# Patient Record
Sex: Female | Born: 1935 | Hispanic: Yes | Smoking: Never smoker
Health system: Southern US, Community
[De-identification: ages and names within clinical notes are randomized; demographics above are authoritative.]

## PROBLEM LIST (undated history)

## (undated) DIAGNOSIS — K219 Gastro-esophageal reflux disease without esophagitis: Secondary | ICD-10-CM

## (undated) DIAGNOSIS — E559 Vitamin D deficiency, unspecified: Secondary | ICD-10-CM

## (undated) DIAGNOSIS — C801 Malignant (primary) neoplasm, unspecified: Secondary | ICD-10-CM

## (undated) DIAGNOSIS — C8590 Non-Hodgkin lymphoma, unspecified, unspecified site: Secondary | ICD-10-CM

## (undated) DIAGNOSIS — I10 Essential (primary) hypertension: Secondary | ICD-10-CM

## (undated) DIAGNOSIS — F329 Major depressive disorder, single episode, unspecified: Secondary | ICD-10-CM

## (undated) DIAGNOSIS — E785 Hyperlipidemia, unspecified: Secondary | ICD-10-CM

## (undated) DIAGNOSIS — R413 Other amnesia: Secondary | ICD-10-CM

## (undated) DIAGNOSIS — R7303 Prediabetes: Secondary | ICD-10-CM

## (undated) DIAGNOSIS — I2510 Atherosclerotic heart disease of native coronary artery without angina pectoris: Secondary | ICD-10-CM

## (undated) DIAGNOSIS — F32A Depression, unspecified: Secondary | ICD-10-CM

## (undated) DIAGNOSIS — M1990 Unspecified osteoarthritis, unspecified site: Secondary | ICD-10-CM

## (undated) HISTORY — PX: CATARACT EXTRACTION: SUR2

## (undated) HISTORY — DX: Atherosclerotic heart disease of native coronary artery without angina pectoris: I25.10

## (undated) HISTORY — PX: CHOLECYSTECTOMY: SHX55

## (undated) HISTORY — DX: Prediabetes: R73.03

## (undated) HISTORY — PX: STOMACH SURGERY: SHX791

## (undated) HISTORY — PX: VARICOSE VEIN SURGERY: SHX832

## (undated) HISTORY — PX: CARDIAC SURGERY: SHX584

## (undated) HISTORY — DX: Other amnesia: R41.3

## (undated) HISTORY — DX: Vitamin D deficiency, unspecified: E55.9

---

## 2011-06-30 DIAGNOSIS — R5383 Other fatigue: Secondary | ICD-10-CM | POA: Diagnosis not present

## 2011-06-30 DIAGNOSIS — R5381 Other malaise: Secondary | ICD-10-CM | POA: Diagnosis not present

## 2011-10-22 DIAGNOSIS — R1084 Generalized abdominal pain: Secondary | ICD-10-CM | POA: Diagnosis not present

## 2011-10-22 DIAGNOSIS — R071 Chest pain on breathing: Secondary | ICD-10-CM | POA: Diagnosis not present

## 2012-03-14 DIAGNOSIS — M1710 Unilateral primary osteoarthritis, unspecified knee: Secondary | ICD-10-CM | POA: Diagnosis not present

## 2012-12-11 DIAGNOSIS — Z862 Personal history of diseases of the blood and blood-forming organs and certain disorders involving the immune mechanism: Secondary | ICD-10-CM | POA: Diagnosis not present

## 2013-06-12 DIAGNOSIS — I119 Hypertensive heart disease without heart failure: Secondary | ICD-10-CM | POA: Diagnosis not present

## 2013-06-12 DIAGNOSIS — I10 Essential (primary) hypertension: Secondary | ICD-10-CM | POA: Diagnosis not present

## 2013-06-12 DIAGNOSIS — F3289 Other specified depressive episodes: Secondary | ICD-10-CM | POA: Diagnosis not present

## 2013-06-12 DIAGNOSIS — I2510 Atherosclerotic heart disease of native coronary artery without angina pectoris: Secondary | ICD-10-CM | POA: Diagnosis not present

## 2013-06-12 DIAGNOSIS — G4700 Insomnia, unspecified: Secondary | ICD-10-CM | POA: Diagnosis not present

## 2013-06-12 DIAGNOSIS — K219 Gastro-esophageal reflux disease without esophagitis: Secondary | ICD-10-CM | POA: Diagnosis not present

## 2013-07-31 DIAGNOSIS — I119 Hypertensive heart disease without heart failure: Secondary | ICD-10-CM | POA: Diagnosis not present

## 2013-07-31 DIAGNOSIS — R5383 Other fatigue: Secondary | ICD-10-CM | POA: Diagnosis not present

## 2013-07-31 DIAGNOSIS — F3289 Other specified depressive episodes: Secondary | ICD-10-CM | POA: Diagnosis not present

## 2013-07-31 DIAGNOSIS — R5381 Other malaise: Secondary | ICD-10-CM | POA: Diagnosis not present

## 2013-07-31 DIAGNOSIS — I2510 Atherosclerotic heart disease of native coronary artery without angina pectoris: Secondary | ICD-10-CM | POA: Diagnosis not present

## 2013-07-31 DIAGNOSIS — K3189 Other diseases of stomach and duodenum: Secondary | ICD-10-CM | POA: Diagnosis not present

## 2013-07-31 DIAGNOSIS — G4700 Insomnia, unspecified: Secondary | ICD-10-CM | POA: Diagnosis not present

## 2013-07-31 DIAGNOSIS — I10 Essential (primary) hypertension: Secondary | ICD-10-CM | POA: Diagnosis not present

## 2013-07-31 DIAGNOSIS — R1013 Epigastric pain: Secondary | ICD-10-CM | POA: Diagnosis not present

## 2013-08-23 DIAGNOSIS — E559 Vitamin D deficiency, unspecified: Secondary | ICD-10-CM | POA: Diagnosis not present

## 2013-08-23 DIAGNOSIS — I2510 Atherosclerotic heart disease of native coronary artery without angina pectoris: Secondary | ICD-10-CM | POA: Diagnosis not present

## 2013-08-23 DIAGNOSIS — I10 Essential (primary) hypertension: Secondary | ICD-10-CM | POA: Diagnosis not present

## 2013-08-23 DIAGNOSIS — E785 Hyperlipidemia, unspecified: Secondary | ICD-10-CM | POA: Diagnosis not present

## 2013-08-23 DIAGNOSIS — I119 Hypertensive heart disease without heart failure: Secondary | ICD-10-CM | POA: Diagnosis not present

## 2013-08-23 DIAGNOSIS — K3189 Other diseases of stomach and duodenum: Secondary | ICD-10-CM | POA: Diagnosis not present

## 2013-08-23 DIAGNOSIS — I739 Peripheral vascular disease, unspecified: Secondary | ICD-10-CM | POA: Diagnosis not present

## 2013-08-23 DIAGNOSIS — R7301 Impaired fasting glucose: Secondary | ICD-10-CM | POA: Diagnosis not present

## 2013-09-03 DIAGNOSIS — R7309 Other abnormal glucose: Secondary | ICD-10-CM | POA: Diagnosis not present

## 2013-09-03 DIAGNOSIS — I10 Essential (primary) hypertension: Secondary | ICD-10-CM | POA: Diagnosis not present

## 2013-09-03 DIAGNOSIS — Z9889 Other specified postprocedural states: Secondary | ICD-10-CM | POA: Diagnosis not present

## 2013-09-03 DIAGNOSIS — I2510 Atherosclerotic heart disease of native coronary artery without angina pectoris: Secondary | ICD-10-CM | POA: Diagnosis not present

## 2013-12-26 DIAGNOSIS — R7301 Impaired fasting glucose: Secondary | ICD-10-CM | POA: Diagnosis not present

## 2013-12-26 DIAGNOSIS — I739 Peripheral vascular disease, unspecified: Secondary | ICD-10-CM | POA: Diagnosis not present

## 2013-12-26 DIAGNOSIS — Z5181 Encounter for therapeutic drug level monitoring: Secondary | ICD-10-CM | POA: Diagnosis not present

## 2013-12-26 DIAGNOSIS — Z79899 Other long term (current) drug therapy: Secondary | ICD-10-CM | POA: Diagnosis not present

## 2013-12-26 DIAGNOSIS — E785 Hyperlipidemia, unspecified: Secondary | ICD-10-CM | POA: Diagnosis not present

## 2013-12-26 DIAGNOSIS — I2510 Atherosclerotic heart disease of native coronary artery without angina pectoris: Secondary | ICD-10-CM | POA: Diagnosis not present

## 2013-12-26 DIAGNOSIS — E559 Vitamin D deficiency, unspecified: Secondary | ICD-10-CM | POA: Diagnosis not present

## 2013-12-26 DIAGNOSIS — R1013 Epigastric pain: Secondary | ICD-10-CM | POA: Diagnosis not present

## 2013-12-26 DIAGNOSIS — K3189 Other diseases of stomach and duodenum: Secondary | ICD-10-CM | POA: Diagnosis not present

## 2013-12-26 DIAGNOSIS — I119 Hypertensive heart disease without heart failure: Secondary | ICD-10-CM | POA: Diagnosis not present

## 2013-12-26 DIAGNOSIS — I10 Essential (primary) hypertension: Secondary | ICD-10-CM | POA: Diagnosis not present

## 2014-01-24 DIAGNOSIS — I10 Essential (primary) hypertension: Secondary | ICD-10-CM | POA: Diagnosis not present

## 2014-01-24 DIAGNOSIS — E785 Hyperlipidemia, unspecified: Secondary | ICD-10-CM | POA: Diagnosis not present

## 2014-01-24 DIAGNOSIS — Z23 Encounter for immunization: Secondary | ICD-10-CM | POA: Diagnosis not present

## 2014-01-24 DIAGNOSIS — M1710 Unilateral primary osteoarthritis, unspecified knee: Secondary | ICD-10-CM | POA: Diagnosis not present

## 2014-01-24 DIAGNOSIS — E559 Vitamin D deficiency, unspecified: Secondary | ICD-10-CM | POA: Diagnosis not present

## 2014-01-24 DIAGNOSIS — I2510 Atherosclerotic heart disease of native coronary artery without angina pectoris: Secondary | ICD-10-CM | POA: Diagnosis not present

## 2014-01-24 DIAGNOSIS — I119 Hypertensive heart disease without heart failure: Secondary | ICD-10-CM | POA: Diagnosis not present

## 2014-01-24 DIAGNOSIS — R7301 Impaired fasting glucose: Secondary | ICD-10-CM | POA: Diagnosis not present

## 2014-01-30 DIAGNOSIS — M76899 Other specified enthesopathies of unspecified lower limb, excluding foot: Secondary | ICD-10-CM | POA: Diagnosis not present

## 2014-02-19 DIAGNOSIS — H5202 Hypermetropia, left eye: Secondary | ICD-10-CM | POA: Diagnosis not present

## 2014-02-19 DIAGNOSIS — H52222 Regular astigmatism, left eye: Secondary | ICD-10-CM | POA: Diagnosis not present

## 2014-02-19 DIAGNOSIS — Z961 Presence of intraocular lens: Secondary | ICD-10-CM | POA: Diagnosis not present

## 2014-02-19 DIAGNOSIS — H3532 Exudative age-related macular degeneration: Secondary | ICD-10-CM | POA: Diagnosis not present

## 2014-02-19 DIAGNOSIS — H3531 Nonexudative age-related macular degeneration: Secondary | ICD-10-CM | POA: Diagnosis not present

## 2014-03-19 ENCOUNTER — Encounter (HOSPITAL_COMMUNITY): Payer: Self-pay | Admitting: Physical Medicine and Rehabilitation

## 2014-03-19 ENCOUNTER — Emergency Department (HOSPITAL_COMMUNITY): Payer: No Typology Code available for payment source

## 2014-03-19 ENCOUNTER — Emergency Department (HOSPITAL_COMMUNITY)
Admission: EM | Admit: 2014-03-19 | Discharge: 2014-03-19 | Disposition: A | Discharge status: Home / Self Care | Payer: No Typology Code available for payment source | Attending: Emergency Medicine | Admitting: Emergency Medicine

## 2014-03-19 DIAGNOSIS — K219 Gastro-esophageal reflux disease without esophagitis: Secondary | ICD-10-CM | POA: Insufficient documentation

## 2014-03-19 DIAGNOSIS — I10 Essential (primary) hypertension: Secondary | ICD-10-CM | POA: Insufficient documentation

## 2014-03-19 DIAGNOSIS — Y9389 Activity, other specified: Secondary | ICD-10-CM | POA: Diagnosis not present

## 2014-03-19 DIAGNOSIS — E785 Hyperlipidemia, unspecified: Secondary | ICD-10-CM | POA: Insufficient documentation

## 2014-03-19 DIAGNOSIS — M542 Cervicalgia: Secondary | ICD-10-CM | POA: Diagnosis not present

## 2014-03-19 DIAGNOSIS — F329 Major depressive disorder, single episode, unspecified: Secondary | ICD-10-CM | POA: Diagnosis not present

## 2014-03-19 DIAGNOSIS — R52 Pain, unspecified: Secondary | ICD-10-CM | POA: Diagnosis not present

## 2014-03-19 DIAGNOSIS — S3991XA Unspecified injury of abdomen, initial encounter: Secondary | ICD-10-CM | POA: Diagnosis not present

## 2014-03-19 DIAGNOSIS — S0003XA Contusion of scalp, initial encounter: Secondary | ICD-10-CM | POA: Diagnosis not present

## 2014-03-19 DIAGNOSIS — Y92410 Unspecified street and highway as the place of occurrence of the external cause: Secondary | ICD-10-CM | POA: Insufficient documentation

## 2014-03-19 DIAGNOSIS — M1990 Unspecified osteoarthritis, unspecified site: Secondary | ICD-10-CM | POA: Diagnosis not present

## 2014-03-19 DIAGNOSIS — Z8572 Personal history of non-Hodgkin lymphomas: Secondary | ICD-10-CM | POA: Insufficient documentation

## 2014-03-19 DIAGNOSIS — R1084 Generalized abdominal pain: Secondary | ICD-10-CM | POA: Diagnosis not present

## 2014-03-19 DIAGNOSIS — S0990XA Unspecified injury of head, initial encounter: Secondary | ICD-10-CM | POA: Diagnosis not present

## 2014-03-19 DIAGNOSIS — R079 Chest pain, unspecified: Secondary | ICD-10-CM | POA: Diagnosis not present

## 2014-03-19 DIAGNOSIS — S199XXA Unspecified injury of neck, initial encounter: Secondary | ICD-10-CM | POA: Diagnosis not present

## 2014-03-19 DIAGNOSIS — R109 Unspecified abdominal pain: Secondary | ICD-10-CM

## 2014-03-19 DIAGNOSIS — S301XXA Contusion of abdominal wall, initial encounter: Secondary | ICD-10-CM | POA: Diagnosis not present

## 2014-03-19 DIAGNOSIS — Z79899 Other long term (current) drug therapy: Secondary | ICD-10-CM | POA: Diagnosis not present

## 2014-03-19 DIAGNOSIS — S0083XA Contusion of other part of head, initial encounter: Secondary | ICD-10-CM | POA: Insufficient documentation

## 2014-03-19 DIAGNOSIS — S299XXA Unspecified injury of thorax, initial encounter: Secondary | ICD-10-CM | POA: Diagnosis not present

## 2014-03-19 DIAGNOSIS — R51 Headache: Secondary | ICD-10-CM | POA: Diagnosis not present

## 2014-03-19 HISTORY — DX: Malignant (primary) neoplasm, unspecified: C80.1

## 2014-03-19 HISTORY — DX: Major depressive disorder, single episode, unspecified: F32.9

## 2014-03-19 HISTORY — DX: Essential (primary) hypertension: I10

## 2014-03-19 HISTORY — DX: Unspecified osteoarthritis, unspecified site: M19.90

## 2014-03-19 HISTORY — DX: Gastro-esophageal reflux disease without esophagitis: K21.9

## 2014-03-19 HISTORY — DX: Non-Hodgkin lymphoma, unspecified, unspecified site: C85.90

## 2014-03-19 HISTORY — DX: Hyperlipidemia, unspecified: E78.5

## 2014-03-19 HISTORY — DX: Depression, unspecified: F32.A

## 2014-03-19 LAB — BASIC METABOLIC PANEL
ANION GAP: 12 (ref 5–15)
BUN: 17 mg/dL (ref 6–23)
CHLORIDE: 102 meq/L (ref 96–112)
CO2: 26 mEq/L (ref 19–32)
Calcium: 9.2 mg/dL (ref 8.4–10.5)
Creatinine, Ser: 0.59 mg/dL (ref 0.50–1.10)
GFR, EST NON AFRICAN AMERICAN: 80 mL/min — AB (ref >90)
Glucose, Bld: 96 mg/dL (ref 70–99)
POTASSIUM: 3.9 meq/L (ref 3.7–5.3)
SODIUM: 140 meq/L (ref 137–147)

## 2014-03-19 LAB — CBC
HCT: 36.1 % (ref 36.0–46.0)
Hemoglobin: 11.3 g/dL — ABNORMAL LOW (ref 12.0–15.0)
MCH: 24.5 pg — ABNORMAL LOW (ref 26.0–34.0)
MCHC: 31.3 g/dL (ref 30.0–36.0)
MCV: 78.1 fL (ref 78.0–100.0)
Platelets: 246 10*3/uL (ref 150–400)
RBC: 4.62 MIL/uL (ref 3.87–5.11)
RDW: 15.8 % — AB (ref 11.5–15.5)
WBC: 5.7 10*3/uL (ref 4.0–10.5)

## 2014-03-19 MED ORDER — ONDANSETRON HCL 4 MG/2ML IJ SOLN
4.0000 mg | Freq: Once | INTRAMUSCULAR | Status: AC
  Administered 2014-03-19: 4 mg via INTRAVENOUS
  Filled 2014-03-19: qty 2

## 2014-03-19 MED ORDER — ONDANSETRON 4 MG PO TBDP: ORAL_TABLET | ORAL | Status: DC

## 2014-03-19 MED ORDER — MORPHINE SULFATE 2 MG/ML IJ SOLN
2.0000 mg | Freq: Once | INTRAMUSCULAR | Status: AC
  Administered 2014-03-19: 2 mg via INTRAVENOUS
  Filled 2014-03-19: qty 1

## 2014-03-19 MED ORDER — MORPHINE SULFATE 4 MG/ML IJ SOLN
4.0000 mg | Freq: Once | INTRAMUSCULAR | Status: AC
  Administered 2014-03-19: 4 mg via INTRAVENOUS
  Filled 2014-03-19: qty 1

## 2014-03-19 MED ORDER — IOHEXOL 300 MG/ML  SOLN
80.0000 mL | Freq: Once | INTRAMUSCULAR | Status: AC | PRN
  Administered 2014-03-19: 80 mL via INTRAVENOUS

## 2014-03-19 NOTE — ED Notes (Signed)
Pt denies head pain at the time.

## 2014-03-19 NOTE — ED Notes (Signed)
PT comfortable with discharge and follow up instructions. Prescriptions x1. 

## 2014-03-19 NOTE — ED Notes (Signed)
Pt returned from Radiology.
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
## 2014-03-19 NOTE — Discharge Instructions (Signed)
Dolor abdominal en las mujeres °(Abdominal Pain, Women) °El dolor abdominal (en el estómago, la pelvis o el vientre) puede tener muchas causas. Es importante que le informe a su médico: °· La ubicación del dolor. °· ¿Viene y se va, o persiste todo el tiempo? °· ¿Hay situaciones que inician el dolor (comer ciertos alimentos, la actividad física)? °· ¿Tiene otros síntomas asociados al dolor (fiebre, náuseas, vómitos, diarrea)? °Todo es de gran ayuda cuando se trata de hallar la causa del dolor. °CAUSAS °· Estómago: Infecciones por virus o bacterias, o úlcera. °· Intestino: Apendicitis (apéndice inflamado), ileitis regional (enfermedad de Crohn), colitis ulcerosa (colon inflamado), síndrome del colon irritable, diverticulitis (inflamación de los divertículos del colon) o cáncer de estómago oo intestino. °· Enfermedades de la vesícula biliar o cálculos. °· Enfermedades renales, cálculos o infecciones en el riñón. °· Infección o cáncer del páncreas. °· Fibromialgia (trastorno doloroso) °· Enfermedades de los órganos femeninos: °¨ Uterus: Útero: fibroma (tumor no canceroso) o infección °¨ Trompas de Falopio: infección o embarazo ectópico °¨ En los ovarios, quistes o tumores. °¨ Adherencias pélvicas (tejido cicatrizal). °¨ Endometriosis (el tejido que cubre el útero se desarrolla en la pelvis y los órganos pélvicos). °¨ Síndrome de congestión pélvica (los órganos femeninos se llenan de sangre antes del periodo menstrual( °¨ Dolor durante el periodo menstrual. °¨ Dolor durante la ovulación (al producir óvulos). °¨ Dolor al usar el DIU (dispositivo intrauterino para el control de la natalidad) °¨ Cáncer en los órganos femeninos. °· Dolor funcional (no está originado en una enfermedad, puede mejorar sin tratamiento). °· Dolor de origen psicológico °· Depresión. °DIAGNÓSTICO °Su médico decidirá la gravedad del dolor a través del examen físico °· Análisis de sangre °· Radiografías °· Ecografías °· TC (tomografía computada, tipo  especial de radiografías). °· IMR (resonancia magnética) °· Cultivos, en el caso una infección °· Colon por enema de bario (se inserta una sustancia de contraste en el intestino grueso para mejorar la observación con rayos X.) °· Colonoscopía (observación del intestino con un tubo luminoso). °· Laparoscopía (examen del interior del abdomen con un tubo que tiene una luz). °· Cirugía exploratoria abdominal mayor (se observa el abdomen realizando una gran incisión). °TRATAMIENTO °El tratamiento dependerá de la causa del problema.  °· Muchos de estos casos pueden controlarse y tratarse en casa. °· Medicamentos de venta libre indicados por el médico. °· Medicamentos con receta. °· Antibióticos, en caso de infección °· Píldoras anticonceptivas, en el caso de períodos dolorosos o dolor al ovular. °· Tratamiento hormonal, para la endometriosis °· Inyecciones para bloqueo nervioso selectivo. °· Fisioterapia. °· Antidepresivos. °· Consejos por parte de un psícólogo o psiquiatra. °· Cirugía mayor o menor. °INSTRUCCIONES PARA EL CUIDADO DOMICILIARIO °· No tome ni administre laxantes a menos que se lo haya indicado su médico. °· Tome analgésicos de venta libre sólo si se lo ha indicado el profesional que lo asiste. No tome aspirina, ya que puede causar molestias en el estómago o hemorragias. °· Consuma una dieta líquida (caldo o agua) según lo indicado por el médico. Progrese lentamente a una dieta blanda, según la tolerancia, si el dolor se relaciona con el estómago o el intestino. °· Tenga un termómetro y tómese la temperatura varias veces al día. °· Haga reposo en la cama y duerma, si esto alivia el dolor. °· Evite las relaciones sexuales, si le producen dolor. °· Evite las situaciones estresantes. °· Cumpla con las visitas y los análisis de control, según las indicaciones de su médico. °· Si el dolor   no se [employer] o la [place], [place] tratar con:  Acupuntura.  Ejercicios de relajacin (yoga,  meditacin).  Terapia grupal.  Psicoterapia. SOLICITE ATENCIN MDICA SI:  Nota que ciertos Writer de [place].  El tratamiento indicado para Lexicographer no Engineer, civil (consulting).  Necesita analgsicos ms fuertes.  Quiere que le retiren el DIU.  Si se siente confundido o desfalleciente.  Presenta nuseas o vmitos.  Aparece una erupcin cutnea.  Sufre efectos adversos o una reaccin alrgica debido a los medicamentos que toma. SOLICITE ATENCIN MDICA DE INMEDIATO SI:  El dolor persiste o se agrava.  Tiene fiebre.  Siente el dolor slo en algunos sectores del abdomen. Si se localiza en la zona derecha, posiblemente podra tratarse de apendicitis. En un adulto, si se localiza en la regin inferior izquierda del abdomen, podra tratarse de colitis o diverticulitis.  Hay sangre en las heces (deposiciones de color rojo brillante o negro alquitranado), con o sin vmitos.  Usted presenta sangre en la orina.  Siente escalofros con o sin fiebre.  Se desmaya. ASEGRESE QUE:   Comprende estas instrucciones.  Controlar su enfermedad.  Solicitar ayuda de inmediato si no mejora o si empeora. Document Released: 08/19/2008 Document Revised: 07/26/2011 [facility] Patient Information 2015 [place]. This information is not intended to replace advice given to you by your health care provider. Make sure you discuss any questions you have with your health care provider.   Emergency Department Resource Guide 1) Find a Doctor and Pay Out of Pocket Although you won't have to find out who is covered by your insurance plan, it is a good idea to ask around and get recommendations. You will then need to call the office and see if the doctor you have chosen will accept you as a new patient and what types of options they offer for patients who are self-pay. Some doctors offer discounts or will set up payment plans for their patients who do not have insurance,  but you will need to ask so you aren't surprised when you get to your appointment.  2) Contact Your Local Health Department Not all health departments have doctors that can see patients for sick visits, but many do, so it is worth a call to see if yours does. If you don't know where your local health department is, you can check in your phone book. The CDC also has a tool to help you locate your state's health department, and many state websites also have listings of all of their local health departments.  3) Find a [facility] If your illness is not likely to be very severe or complicated, you may want to try a walk in clinic. These are popping up all over the country in pharmacies, drugstores, and shopping centers. They're usually staffed by nurse practitioners or physician assistants that have been trained to treat common illnesses and complaints. They're usually fairly quick and inexpensive. However, if you have serious medical issues or chronic medical problems, these are probably not your best option.  No Primary Care Doctor: - Call Health Connect at  [phone] - they can help you locate a primary care doctor that  accepts your insurance, provides certain services, etc. - Physician Referral Service- [phone]  Chronic Pain Problems: Organization         Address  Phone   Notes  [facility]  [phone] Patients need to be referred by their primary care doctor.  Medication Assistance: Organization         Address  Phone   Notes  [facility] Medication [facility] [address] [place], [place] [address] [phone] --Must be a resident of [facility] -- Must have NO insurance coverage whatsoever (no Medicaid/ Medicare, etc.) -- The pt. MUST have a primary care doctor that directs their care regularly and follows them in the community   MedAssist  [phone]   [employer]  [phone]    Agencies that provide inexpensive  medical care: Organization         Address  Phone   Notes  [place]  [phone]   [facility]    [phone]   [facility] [place], [place] [address] [phone]   [facility] [address] [place] [phone]   Planned Parenthood    [phone]   [facility]    [phone]   [place] and [place] [address], [place] Phone:  [phone], Fax:  [phone] Hours of Operation:  9 am - 6 pm, M-F.  Also accepts Medicaid/Medicare and self-pay.  [facility] for [address], [place] Phone: [phone], Fax: [phone]. Hours of Operation:  8:30 am - 5:30 pm, M-F.  Also accepts Medicaid and self-pay.  [facility] [address] [place] Phone: [phone]   [place], [place], [place] [phone], Ext. [phone] Mondays & Thursdays: 7-9 AM.  First 15 patients are seen on a first come, first serve basis.    [place] Providers:  Organization         Address  Phone   Notes  [facility] [address], [place] [phone] Also accepts self-pay patients.  [facility] [address], [place]  [phone]   [place], [address], [place] [phone]   [facility] [address] [place] [phone]   Lucianne Lei [address], [place]   [phone] Only accepts [place] Access [place] patients after they have their name applied to their card.   Self-Pay (no insurance) in [facility]:  Organization         Address  Phone   Notes  Sickle Cell Patients, [facility] [place] [phone]   [facility] [place] [phone]   [facility] [place]  [place], [place], [place] [phone]   [facility]/Dr. Osei-Bonsu  [address] [place] or [address], [place] [phone] Phone number for both [place] and [place] locations is the same.  Urgent Medical and [facility] [address] [place] [phone]   [place] [address] [place] or [address] [phone]   [facility] [address], [place] [phone], phone; [phone], fax Sees patients 1st and 3rd Saturday of every month.  Must not qualify for public or private insurance (i.e. Medicaid, Medicare, [place] Health Choice, Veterans' Benefits)  Household income should be no more than 200% of the poverty level The  clinic cannot treat you if you are pregnant or think you are pregnant  Sexually transmitted diseases are not treated at the clinic.    Dental Care: Organization         Address  Phone  Notes  [facility] [place] [phone] Accepts children up to age 88 who are enrolled in [place] or [place]; pregnant women with a Medicaid card; and children who have applied for Medicaid or [place] Health Choice, but were declined, whose parents can pay a reduced fee at time of service.  [facility] Department of [facility]  [address], [place] [phone] Accepts children up to age 59 who are enrolled in [place] or [place]; pregnant women with a Medicaid card; and children who have applied for Medicaid or [place] Health Choice, but were declined, whose parents can pay a reduced fee at time of service.  [place] Adult Dental Access PROGRAM  [place] [phone] Patients are seen by appointment only. Walk-ins are not accepted.  [place] [facility] [place] will see patients 52 years of age and older. Monday - Tuesday (8am-5pm) Most Wednesdays (8:30-5pm) $30 per visit, cash only  [facility] Adult Dental Access PROGRAM  [address], [facility] [phone] Patients are seen by appointment only. Walk-ins are not accepted. [place] will see patients 7 years of age and older. One Wednesday Evening (Monthly: Volunteer Based).  $30 per visit, cash only  [place]  [phone] for adults; Children under age 62, call Graduate Pediatric Dentistry at [phone]. Children aged 55-14, please call [phone] to request a pediatric application.  Dental services are provided in all areas of dental care including fillings, crowns and bridges, complete and partial dentures, implants, gum treatment, root canals, and extractions. Preventive care is also provided. Treatment is provided to both adults and children. Patients are selected via a lottery and there is often a waiting list.   [facility] [address] [place]  [phone] [email or web address]   [facility] [address] [place], [place] [phone], Ext. [phone] Second and Fourth Thursday of each month, opens at 6:30 AM; Clinic ends at 9 AM.  Patients are seen on a first-come first-served basis, and a limited number are seen during each clinic.   [facility]  [address] Hillard Danker [place], [place] [phone]   Eligibility Requirements You must have lived in [place], [place], or [place] counties for at least the last three months.   You cannot be eligible for state or federal sponsored [employer], including [employer], [place], or [employer].   You generally cannot be eligible for healthcare insurance through your employer.    How to apply: Eligibility screenings are held every Tuesday and Wednesday afternoon from 1:00 pm until 4:00 pm. You do not need an appointment for the  interview!  [facility] [address] [place], [place]   [place]  [employer] Department  [place]  [phone]    Behavioral Health Resources in the Community: Intensive Outpatient Programs Organization         Address  Phone  Notes  [place]. [address] [place], [place] [phone]   [facility] [address] [place],  [place] [phone]   ADS: Alcohol & Drug Svcs [address], [place], [place]   [place] [address]  [place], [place] or [phone]   Substance Abuse Resources Organization         Address  Phone  Notes  Alcohol and Drug Services  [phone]   [place]  [phone]   The [place]   Chinita Pester  [phone]   Residential & Outpatient Substance Abuse Program  [phone]   Psychological Services Organization         Address  Phone  Notes  [facility] [place]  [place]  [phone]   [place] [address], [place] or [phone]    Mobile Crisis Teams Organization         Address  Phone  Notes  [facility], Mobile Crisis Care Unit  [phone]   [facility]  [address] [place], [place]   [place] [address] [place] [phone]    Self-Help/Support Groups Organization         Address  Phone             Notes  [place]. of [place] - variety of support groups  [place] Call for more information  Narcotics Anonymous (NA), [facility] [address], [employer] [place]  2 meetings at this location   Special educational needs teacher         Address  Phone  Notes  ASAP Residential Treatment  [place],    [place]  [phone]   [facility]  [address], [place] [id number], [place], [place]   [place], [place] [phone] Admissions: 8am-3pm M-F  Incentives Substance [place] [address],    [place], [place] [phone]   [facility] [address] [place], [place], [place]   [facility] [address],  [place], [place]   [facility] Programs - Intensive Outpatient [address] Kristeen [address], [place], [place]   [facility] ([place].) [place].,  [place], [place] [phone] or [phone]   [facility] Services (RTS) [address] [place], [place] Accepts Medicaid  [facility] [address]  [place] [phone] Substance Abuse/Addiction Treatment   [facility] Organization         Address  Phone  Notes  [facility]  [phone]   Domenic Schwab, PhD [address] Arlis Porta [place], [place]   [phone] or [phone]   [facility] [place], [place] [phone]   [facility] [address] [place], [place] [phone] Insurance/Medicaid/sponsorship through [facility] and Families [address]                                    [place], [place] [phone] [place] [address], [place] [phone]    Dr. Adele Schilder  [phone]   [facility] Dept. 1) [address], [place] 2) [place] 3)  [address], [place] [phone]  (  [place] [phone] or [phone] (After Hours)
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
## 2014-04-08 DIAGNOSIS — R7309 Other abnormal glucose: Secondary | ICD-10-CM | POA: Diagnosis not present

## 2014-04-08 DIAGNOSIS — K30 Functional dyspepsia: Secondary | ICD-10-CM | POA: Diagnosis not present

## 2014-04-08 DIAGNOSIS — E785 Hyperlipidemia, unspecified: Secondary | ICD-10-CM | POA: Diagnosis not present

## 2014-04-08 DIAGNOSIS — G4700 Insomnia, unspecified: Secondary | ICD-10-CM | POA: Diagnosis not present

## 2014-04-08 DIAGNOSIS — I10 Essential (primary) hypertension: Secondary | ICD-10-CM | POA: Diagnosis not present

## 2014-04-08 DIAGNOSIS — I119 Hypertensive heart disease without heart failure: Secondary | ICD-10-CM | POA: Diagnosis not present

## 2014-04-08 DIAGNOSIS — E559 Vitamin D deficiency, unspecified: Secondary | ICD-10-CM | POA: Diagnosis not present

## 2014-04-08 DIAGNOSIS — F329 Major depressive disorder, single episode, unspecified: Secondary | ICD-10-CM | POA: Diagnosis not present

## 2014-04-08 DIAGNOSIS — I2510 Atherosclerotic heart disease of native coronary artery without angina pectoris: Secondary | ICD-10-CM | POA: Diagnosis not present

## 2014-05-06 DIAGNOSIS — E785 Hyperlipidemia, unspecified: Secondary | ICD-10-CM | POA: Diagnosis not present

## 2014-05-06 DIAGNOSIS — R7309 Other abnormal glucose: Secondary | ICD-10-CM | POA: Diagnosis not present

## 2014-05-06 DIAGNOSIS — K30 Functional dyspepsia: Secondary | ICD-10-CM | POA: Diagnosis not present

## 2014-05-06 DIAGNOSIS — F329 Major depressive disorder, single episode, unspecified: Secondary | ICD-10-CM | POA: Diagnosis not present

## 2014-05-06 DIAGNOSIS — I10 Essential (primary) hypertension: Secondary | ICD-10-CM | POA: Diagnosis not present

## 2014-05-06 DIAGNOSIS — I2510 Atherosclerotic heart disease of native coronary artery without angina pectoris: Secondary | ICD-10-CM | POA: Diagnosis not present

## 2014-05-06 DIAGNOSIS — I119 Hypertensive heart disease without heart failure: Secondary | ICD-10-CM | POA: Diagnosis not present

## 2014-05-06 DIAGNOSIS — G4700 Insomnia, unspecified: Secondary | ICD-10-CM | POA: Diagnosis not present

## 2014-05-06 DIAGNOSIS — E559 Vitamin D deficiency, unspecified: Secondary | ICD-10-CM | POA: Diagnosis not present

## 2014-06-20 DIAGNOSIS — I2510 Atherosclerotic heart disease of native coronary artery without angina pectoris: Secondary | ICD-10-CM | POA: Diagnosis not present

## 2014-06-20 DIAGNOSIS — Z951 Presence of aortocoronary bypass graft: Secondary | ICD-10-CM | POA: Diagnosis not present

## 2014-06-20 DIAGNOSIS — E785 Hyperlipidemia, unspecified: Secondary | ICD-10-CM | POA: Diagnosis not present

## 2014-06-20 DIAGNOSIS — R739 Hyperglycemia, unspecified: Secondary | ICD-10-CM | POA: Diagnosis not present

## 2014-06-21 DIAGNOSIS — R7309 Other abnormal glucose: Secondary | ICD-10-CM | POA: Diagnosis not present

## 2014-06-21 DIAGNOSIS — E559 Vitamin D deficiency, unspecified: Secondary | ICD-10-CM | POA: Diagnosis not present

## 2014-06-21 DIAGNOSIS — I2510 Atherosclerotic heart disease of native coronary artery without angina pectoris: Secondary | ICD-10-CM | POA: Diagnosis not present

## 2014-06-21 DIAGNOSIS — I10 Essential (primary) hypertension: Secondary | ICD-10-CM | POA: Diagnosis not present

## 2014-06-21 DIAGNOSIS — K30 Functional dyspepsia: Secondary | ICD-10-CM | POA: Diagnosis not present

## 2014-06-21 DIAGNOSIS — F329 Major depressive disorder, single episode, unspecified: Secondary | ICD-10-CM | POA: Diagnosis not present

## 2014-06-21 DIAGNOSIS — I119 Hypertensive heart disease without heart failure: Secondary | ICD-10-CM | POA: Diagnosis not present

## 2014-06-21 DIAGNOSIS — G4700 Insomnia, unspecified: Secondary | ICD-10-CM | POA: Diagnosis not present

## 2014-06-21 DIAGNOSIS — E785 Hyperlipidemia, unspecified: Secondary | ICD-10-CM | POA: Diagnosis not present

## 2014-07-08 ENCOUNTER — Ambulatory Visit (INDEPENDENT_AMBULATORY_CARE_PROVIDER_SITE_OTHER): Payer: Medicare Other | Admitting: Neurology

## 2014-07-08 ENCOUNTER — Encounter: Payer: Self-pay | Admitting: *Deleted

## 2014-07-08 ENCOUNTER — Encounter: Payer: Self-pay | Admitting: Neurology

## 2014-07-08 VITALS — BP 125/68 | HR 85 | Ht 59.0 in | Wt 103.0 lb

## 2014-07-08 DIAGNOSIS — F0390 Unspecified dementia without behavioral disturbance: Secondary | ICD-10-CM | POA: Diagnosis not present

## 2014-07-08 MED ORDER — MEMANTINE HCL 10 MG PO TABS: 10.0000 mg | ORAL_TABLET | Freq: Two times a day (BID) | ORAL | Status: DC
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
## 2014-08-09 DIAGNOSIS — R109 Unspecified abdominal pain: Secondary | ICD-10-CM | POA: Diagnosis not present

## 2014-08-09 DIAGNOSIS — I10 Essential (primary) hypertension: Secondary | ICD-10-CM | POA: Diagnosis not present

## 2014-08-09 DIAGNOSIS — E785 Hyperlipidemia, unspecified: Secondary | ICD-10-CM | POA: Diagnosis not present

## 2014-08-09 DIAGNOSIS — R7309 Other abnormal glucose: Secondary | ICD-10-CM | POA: Diagnosis not present

## 2014-08-09 DIAGNOSIS — M170 Bilateral primary osteoarthritis of knee: Secondary | ICD-10-CM | POA: Diagnosis not present

## 2014-08-09 DIAGNOSIS — I2510 Atherosclerotic heart disease of native coronary artery without angina pectoris: Secondary | ICD-10-CM | POA: Diagnosis not present

## 2014-08-09 DIAGNOSIS — Z136 Encounter for screening for cardiovascular disorders: Secondary | ICD-10-CM | POA: Diagnosis not present

## 2014-08-09 DIAGNOSIS — I119 Hypertensive heart disease without heart failure: Secondary | ICD-10-CM | POA: Diagnosis not present

## 2014-08-09 DIAGNOSIS — E559 Vitamin D deficiency, unspecified: Secondary | ICD-10-CM | POA: Diagnosis not present

## 2014-08-14 DIAGNOSIS — I10 Essential (primary) hypertension: Secondary | ICD-10-CM | POA: Diagnosis not present

## 2014-08-14 DIAGNOSIS — I119 Hypertensive heart disease without heart failure: Secondary | ICD-10-CM | POA: Diagnosis not present

## 2014-08-14 DIAGNOSIS — R7309 Other abnormal glucose: Secondary | ICD-10-CM | POA: Diagnosis not present

## 2014-08-14 DIAGNOSIS — E559 Vitamin D deficiency, unspecified: Secondary | ICD-10-CM | POA: Diagnosis not present

## 2014-08-14 DIAGNOSIS — Z87440 Personal history of urinary (tract) infections: Secondary | ICD-10-CM | POA: Diagnosis not present

## 2014-08-14 DIAGNOSIS — E785 Hyperlipidemia, unspecified: Secondary | ICD-10-CM | POA: Diagnosis not present

## 2014-08-14 DIAGNOSIS — M170 Bilateral primary osteoarthritis of knee: Secondary | ICD-10-CM | POA: Diagnosis not present

## 2014-08-14 DIAGNOSIS — R42 Dizziness and giddiness: Secondary | ICD-10-CM | POA: Diagnosis not present

## 2014-08-14 DIAGNOSIS — I2510 Atherosclerotic heart disease of native coronary artery without angina pectoris: Secondary | ICD-10-CM | POA: Diagnosis not present

## 2014-09-11 ENCOUNTER — Encounter: Payer: Self-pay | Admitting: Neurology

## 2014-09-11 ENCOUNTER — Ambulatory Visit (INDEPENDENT_AMBULATORY_CARE_PROVIDER_SITE_OTHER): Payer: Medicare Other | Admitting: Neurology

## 2014-09-11 VITALS — BP 109/63 | HR 81 | Ht 59.0 in | Wt 102.0 lb

## 2014-09-11 DIAGNOSIS — F0390 Unspecified dementia without behavioral disturbance: Secondary | ICD-10-CM | POA: Diagnosis not present

## 2014-09-11 MED ORDER — DONEPEZIL HCL 10 MG PO TABS: 10.0000 mg | ORAL_TABLET | Freq: Every day | ORAL | Status: DC
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
## 2014-09-12 ENCOUNTER — Telehealth: Payer: Self-pay | Admitting: Neurology

## 2014-09-12 NOTE — Telephone Encounter (Signed)
I called back.  Spoke with daughter.  She a few minutes after the patient took Aricept, she ate some strawberries and then vomited.  Says it was all yellow in color.  Denied any other symptoms.  Says Mom is laying down, seems to be doing well at this time.  Would like to know if she should continue Aricept tomorrow, or if something different is recommended.  Please advise.  Thank you.

## 2014-09-12 NOTE — Telephone Encounter (Signed)
Monalisa, pt's daughter called stating that the patient might be having a side effect from donepezil (ARICEPT) 10 MG tablet. Patient started to vomit today. Please call and advice # [phone]

## 2014-09-12 NOTE — Telephone Encounter (Signed)
Spoke to daughter, she will try giving her 0.5 tab for 1-2 weeks after a meal.  She will call back with any concerns.

## 2014-09-12 NOTE — Telephone Encounter (Signed)
She may start Aricept 10mg  1/2 tab po after meal, for 1-2 weeks, if she can tolerate it then go to one tab.

## 2014-09-23 DIAGNOSIS — I10 Essential (primary) hypertension: Secondary | ICD-10-CM | POA: Diagnosis not present

## 2014-09-23 DIAGNOSIS — K649 Unspecified hemorrhoids: Secondary | ICD-10-CM | POA: Diagnosis not present

## 2014-09-23 DIAGNOSIS — K219 Gastro-esophageal reflux disease without esophagitis: Secondary | ICD-10-CM | POA: Diagnosis not present

## 2014-09-23 DIAGNOSIS — R7309 Other abnormal glucose: Secondary | ICD-10-CM | POA: Diagnosis not present

## 2014-09-23 DIAGNOSIS — F329 Major depressive disorder, single episode, unspecified: Secondary | ICD-10-CM | POA: Diagnosis not present

## 2014-09-23 DIAGNOSIS — I739 Peripheral vascular disease, unspecified: Secondary | ICD-10-CM | POA: Diagnosis not present

## 2014-09-23 DIAGNOSIS — I2510 Atherosclerotic heart disease of native coronary artery without angina pectoris: Secondary | ICD-10-CM | POA: Diagnosis not present

## 2014-09-23 DIAGNOSIS — E559 Vitamin D deficiency, unspecified: Secondary | ICD-10-CM | POA: Diagnosis not present

## 2014-09-23 DIAGNOSIS — E785 Hyperlipidemia, unspecified: Secondary | ICD-10-CM | POA: Diagnosis not present

## 2014-10-28 DIAGNOSIS — E559 Vitamin D deficiency, unspecified: Secondary | ICD-10-CM | POA: Diagnosis not present

## 2014-10-28 DIAGNOSIS — I2510 Atherosclerotic heart disease of native coronary artery without angina pectoris: Secondary | ICD-10-CM | POA: Diagnosis not present

## 2014-10-28 DIAGNOSIS — E785 Hyperlipidemia, unspecified: Secondary | ICD-10-CM | POA: Diagnosis not present

## 2014-10-28 DIAGNOSIS — F329 Major depressive disorder, single episode, unspecified: Secondary | ICD-10-CM | POA: Diagnosis not present

## 2014-10-28 DIAGNOSIS — I119 Hypertensive heart disease without heart failure: Secondary | ICD-10-CM | POA: Diagnosis not present

## 2014-10-28 DIAGNOSIS — R7309 Other abnormal glucose: Secondary | ICD-10-CM | POA: Diagnosis not present

## 2014-10-28 DIAGNOSIS — M170 Bilateral primary osteoarthritis of knee: Secondary | ICD-10-CM | POA: Diagnosis not present

## 2014-10-28 DIAGNOSIS — I10 Essential (primary) hypertension: Secondary | ICD-10-CM | POA: Diagnosis not present
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
## 2015-02-04 ENCOUNTER — Other Ambulatory Visit: Payer: Self-pay | Admitting: Neurology

## 2015-02-05 ENCOUNTER — Telehealth: Payer: Self-pay | Admitting: Neurology

## 2015-02-05 DIAGNOSIS — F0280 Dementia in other diseases classified elsewhere without behavioral disturbance: Secondary | ICD-10-CM

## 2015-02-05 DIAGNOSIS — G309 Alzheimer's disease, unspecified: Principal | ICD-10-CM

## 2015-02-05 MED ORDER — MEMANTINE HCL 10 MG PO TABS: 10.0000 mg | ORAL_TABLET | Freq: Two times a day (BID) | ORAL | Status: DC

## 2015-02-05 NOTE — Telephone Encounter (Signed)
Rx sent to the pharmacy.

## 2015-02-05 NOTE — Telephone Encounter (Signed)
Daughter called to follow up on refill of memantine (NAMENDA) 10 MG tablet, [place]

## 2015-03-07 DIAGNOSIS — M170 Bilateral primary osteoarthritis of knee: Secondary | ICD-10-CM | POA: Diagnosis not present

## 2015-03-07 DIAGNOSIS — E785 Hyperlipidemia, unspecified: Secondary | ICD-10-CM | POA: Diagnosis not present

## 2015-03-07 DIAGNOSIS — Z23 Encounter for immunization: Secondary | ICD-10-CM | POA: Diagnosis not present

## 2015-03-07 DIAGNOSIS — F329 Major depressive disorder, single episode, unspecified: Secondary | ICD-10-CM | POA: Diagnosis not present

## 2015-03-07 DIAGNOSIS — E559 Vitamin D deficiency, unspecified: Secondary | ICD-10-CM | POA: Diagnosis not present

## 2015-03-07 DIAGNOSIS — R7301 Impaired fasting glucose: Secondary | ICD-10-CM | POA: Diagnosis not present

## 2015-03-07 DIAGNOSIS — I10 Essential (primary) hypertension: Secondary | ICD-10-CM | POA: Diagnosis not present

## 2015-03-07 DIAGNOSIS — I119 Hypertensive heart disease without heart failure: Secondary | ICD-10-CM | POA: Diagnosis not present

## 2015-03-07 DIAGNOSIS — I2510 Atherosclerotic heart disease of native coronary artery without angina pectoris: Secondary | ICD-10-CM | POA: Diagnosis not present

## 2015-03-07 DIAGNOSIS — R7309 Other abnormal glucose: Secondary | ICD-10-CM | POA: Diagnosis not present

## 2015-03-07 DIAGNOSIS — I739 Peripheral vascular disease, unspecified: Secondary | ICD-10-CM | POA: Diagnosis not present

## 2015-03-13 ENCOUNTER — Ambulatory Visit (INDEPENDENT_AMBULATORY_CARE_PROVIDER_SITE_OTHER): Payer: Medicare Other | Admitting: Nurse Practitioner

## 2015-03-13 ENCOUNTER — Encounter: Payer: Self-pay | Admitting: Nurse Practitioner

## 2015-03-13 VITALS — BP 127/67 | HR 75 | Ht 59.0 in | Wt 100.0 lb

## 2015-03-13 DIAGNOSIS — F0280 Dementia in other diseases classified elsewhere without behavioral disturbance: Secondary | ICD-10-CM

## 2015-03-13 DIAGNOSIS — G301 Alzheimer's disease with late onset: Secondary | ICD-10-CM

## 2015-03-13 DIAGNOSIS — F0390 Unspecified dementia without behavioral disturbance: Secondary | ICD-10-CM | POA: Diagnosis not present

## 2015-03-13 MED ORDER — RIVASTIGMINE 4.6 MG/24HR TD PT24: 4.6000 mg | MEDICATED_PATCH | Freq: Every day | TRANSDERMAL | Status: DC

## 2015-03-13 MED ORDER — MEMANTINE HCL 10 MG PO TABS: 10.0000 mg | ORAL_TABLET | Freq: Two times a day (BID) | ORAL | Status: DC
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
## 2015-03-13 NOTE — Patient Instructions (Addendum)
Keep Namenda 10 mg twice a day, will refill Exelon patch 4.6 mg daily Small frequent meals F/U in 6 months

## 2015-03-17 NOTE — Progress Notes (Signed)
I have reviewed and agreed above plan. 

## 2015-05-30 DIAGNOSIS — I10 Essential (primary) hypertension: Secondary | ICD-10-CM | POA: Diagnosis not present

## 2015-05-30 DIAGNOSIS — I739 Peripheral vascular disease, unspecified: Secondary | ICD-10-CM | POA: Diagnosis not present

## 2015-05-30 DIAGNOSIS — M170 Bilateral primary osteoarthritis of knee: Secondary | ICD-10-CM | POA: Diagnosis not present

## 2015-05-30 DIAGNOSIS — I2510 Atherosclerotic heart disease of native coronary artery without angina pectoris: Secondary | ICD-10-CM | POA: Diagnosis not present

## 2015-05-30 DIAGNOSIS — F329 Major depressive disorder, single episode, unspecified: Secondary | ICD-10-CM | POA: Diagnosis not present

## 2015-05-30 DIAGNOSIS — R7301 Impaired fasting glucose: Secondary | ICD-10-CM | POA: Diagnosis not present

## 2015-05-30 DIAGNOSIS — E785 Hyperlipidemia, unspecified: Secondary | ICD-10-CM | POA: Diagnosis not present

## 2015-05-30 DIAGNOSIS — E559 Vitamin D deficiency, unspecified: Secondary | ICD-10-CM | POA: Diagnosis not present

## 2015-06-26 DIAGNOSIS — R739 Hyperglycemia, unspecified: Secondary | ICD-10-CM | POA: Diagnosis not present

## 2015-06-26 DIAGNOSIS — E785 Hyperlipidemia, unspecified: Secondary | ICD-10-CM | POA: Diagnosis not present

## 2015-06-26 DIAGNOSIS — I2510 Atherosclerotic heart disease of native coronary artery without angina pectoris: Secondary | ICD-10-CM | POA: Diagnosis not present

## 2015-06-26 DIAGNOSIS — Z951 Presence of aortocoronary bypass graft: Secondary | ICD-10-CM | POA: Diagnosis not present

## 2015-07-18 ENCOUNTER — Telehealth: Payer: Self-pay | Admitting: Nurse Practitioner

## 2015-07-18 NOTE — Telephone Encounter (Signed)
Pt's daughter called said pt is having anger issues since Tuesday 07/15/15. Sts pt will just burst out screaming, she will look at them like she is going to kill them,Tuesday pt opened the front door and ran out into the rain-crying she wanted to kill herself. Yesterday she started looking for things-taking things out of the drawers and doing this with rage. She told her daughter she does these things because she forgets where her things are and gets so angry. Daughter said she sometimes she doesn't think the pt is in the future as she talks about events in the past. She sts today she looks calm. Daughter is asking if there is a medication that could be added to help with the anger.

## 2015-07-18 NOTE — Telephone Encounter (Signed)
I called and spoke to daughter.  Pt has had episodes in past, but seem to be more frequent.  On namenda and exelon patches.  Encouraged hydration.  ? Needing other medication.  Made appt to evaluate  07-23-15 at 1030.

## 2015-07-23 ENCOUNTER — Encounter: Payer: Self-pay | Admitting: Nurse Practitioner

## 2015-07-23 ENCOUNTER — Ambulatory Visit (INDEPENDENT_AMBULATORY_CARE_PROVIDER_SITE_OTHER): Payer: Medicare Other | Admitting: Nurse Practitioner

## 2015-07-23 VITALS — BP 106/60 | HR 74 | Resp 18 | Ht 59.0 in | Wt 100.6 lb

## 2015-07-23 DIAGNOSIS — G301 Alzheimer's disease with late onset: Secondary | ICD-10-CM | POA: Diagnosis not present

## 2015-07-23 DIAGNOSIS — F0391 Unspecified dementia with behavioral disturbance: Secondary | ICD-10-CM

## 2015-07-23 DIAGNOSIS — R451 Restlessness and agitation: Secondary | ICD-10-CM

## 2015-07-23 DIAGNOSIS — F0280 Dementia in other diseases classified elsewhere without behavioral disturbance: Secondary | ICD-10-CM | POA: Diagnosis not present

## 2015-07-23 MED ORDER — MEMANTINE HCL 10 MG PO TABS: 10.0000 mg | ORAL_TABLET | Freq: Two times a day (BID) | ORAL | Status: DC

## 2015-07-23 MED ORDER — RIVASTIGMINE 4.6 MG/24HR TD PT24: 4.6000 mg | MEDICATED_PATCH | Freq: Every day | TRANSDERMAL | Status: DC

## 2015-07-23 NOTE — Patient Instructions (Signed)
Continue Exelon patch will refill Continue Namenda will refill F/U in 6 months

## 2015-07-23 NOTE — Progress Notes (Signed)
I have reviewed and agreed above plan. 
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
## 2015-09-03 DIAGNOSIS — K219 Gastro-esophageal reflux disease without esophagitis: Secondary | ICD-10-CM | POA: Diagnosis not present

## 2015-09-03 DIAGNOSIS — I739 Peripheral vascular disease, unspecified: Secondary | ICD-10-CM | POA: Diagnosis not present

## 2015-09-03 DIAGNOSIS — G4700 Insomnia, unspecified: Secondary | ICD-10-CM | POA: Diagnosis not present

## 2015-09-03 DIAGNOSIS — I2510 Atherosclerotic heart disease of native coronary artery without angina pectoris: Secondary | ICD-10-CM | POA: Diagnosis not present

## 2015-09-03 DIAGNOSIS — I10 Essential (primary) hypertension: Secondary | ICD-10-CM | POA: Diagnosis not present

## 2015-09-03 DIAGNOSIS — R7303 Prediabetes: Secondary | ICD-10-CM | POA: Diagnosis not present

## 2015-09-03 DIAGNOSIS — F329 Major depressive disorder, single episode, unspecified: Secondary | ICD-10-CM | POA: Diagnosis not present

## 2015-09-03 DIAGNOSIS — E785 Hyperlipidemia, unspecified: Secondary | ICD-10-CM | POA: Diagnosis not present

## 2015-09-03 DIAGNOSIS — E559 Vitamin D deficiency, unspecified: Secondary | ICD-10-CM | POA: Diagnosis not present

## 2015-09-03 DIAGNOSIS — M170 Bilateral primary osteoarthritis of knee: Secondary | ICD-10-CM | POA: Diagnosis not present

## 2015-09-11 ENCOUNTER — Ambulatory Visit: Payer: Medicare Other | Admitting: Nurse Practitioner

## 2015-09-24 ENCOUNTER — Telehealth: Payer: Self-pay | Admitting: Nurse Practitioner

## 2015-09-24 ENCOUNTER — Encounter: Payer: Self-pay | Admitting: *Deleted

## 2015-09-24 NOTE — Telephone Encounter (Signed)
Letter composed, reviewed and signed by Daun Peacock, NP.  Sent to [facility] for mailing.
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
## 2015-09-24 NOTE — Telephone Encounter (Signed)
Called daughter who stated she is having hip replacement in June, so her mother, who lives with her is traveling to [place] with patient's son. Daughter stated she is very concerned about the possibility of her mother and brother encountering difficulties with the airlines including "being taken off the plane". She is requesting a letter stating her mother has dementia and cannot travel alone. She stated she wants to be sure her mother is taken care of in the event they have difficulties. She requested the letter be mailed to her home. Informed daughter this RN will route her request to Daun Peacock. Will call her back only as needed. She verbalized understanding.

## 2015-09-24 NOTE — Telephone Encounter (Signed)
Please write and I will sign 

## 2016-02-11 ENCOUNTER — Telehealth: Payer: Self-pay | Admitting: *Deleted

## 2016-02-11 MED ORDER — RIVASTIGMINE 4.6 MG/24HR TD PT24: 4.6000 mg | MEDICATED_PATCH | Freq: Every day | TRANSDERMAL | 1 refills | Status: DC

## 2016-02-11 NOTE — Telephone Encounter (Signed)
Received fax for refill on exelon patches.  Original prescription was transferred to [place], and this pharmacy is closed.

## 2016-02-25 DIAGNOSIS — J302 Other seasonal allergic rhinitis: Secondary | ICD-10-CM | POA: Diagnosis not present

## 2016-02-25 DIAGNOSIS — F329 Major depressive disorder, single episode, unspecified: Secondary | ICD-10-CM | POA: Diagnosis not present

## 2016-02-25 DIAGNOSIS — E785 Hyperlipidemia, unspecified: Secondary | ICD-10-CM | POA: Diagnosis not present

## 2016-02-25 DIAGNOSIS — E559 Vitamin D deficiency, unspecified: Secondary | ICD-10-CM | POA: Diagnosis not present

## 2016-02-25 DIAGNOSIS — B372 Candidiasis of skin and nail: Secondary | ICD-10-CM | POA: Diagnosis not present

## 2016-03-09 ENCOUNTER — Ambulatory Visit (INDEPENDENT_AMBULATORY_CARE_PROVIDER_SITE_OTHER): Payer: Medicare Other | Admitting: Nurse Practitioner

## 2016-03-09 ENCOUNTER — Encounter: Payer: Self-pay | Admitting: Nurse Practitioner

## 2016-03-09 VITALS — BP 115/62 | HR 85 | Ht 59.0 in | Wt 98.0 lb

## 2016-03-09 DIAGNOSIS — F0280 Dementia in other diseases classified elsewhere without behavioral disturbance: Secondary | ICD-10-CM

## 2016-03-09 DIAGNOSIS — G301 Alzheimer's disease with late onset: Secondary | ICD-10-CM | POA: Diagnosis not present

## 2016-03-09 DIAGNOSIS — F0390 Unspecified dementia without behavioral disturbance: Secondary | ICD-10-CM

## 2016-03-09 MED ORDER — RIVASTIGMINE 4.6 MG/24HR TD PT24: 4.6000 mg | MEDICATED_PATCH | Freq: Every day | TRANSDERMAL | 6 refills | Status: DC

## 2016-03-09 MED ORDER — MEMANTINE HCL 10 MG PO TABS
10.0000 mg | ORAL_TABLET | Freq: Two times a day (BID) | ORAL | 11 refills | Status: DC
Start: 2016-03-09 — End: 2016-09-07
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
## 2016-03-09 NOTE — Patient Instructions (Signed)
Continue Exelon patch will refill Continue Namenda will refill Follow up in 6 months next with Dr. Krista Blue

## 2016-03-15 NOTE — Progress Notes (Signed)
I have reviewed and agreed above plan. 

## 2016-03-30 DIAGNOSIS — L602 Onychogryphosis: Secondary | ICD-10-CM | POA: Diagnosis not present

## 2016-04-07 DIAGNOSIS — F329 Major depressive disorder, single episode, unspecified: Secondary | ICD-10-CM | POA: Diagnosis not present

## 2016-04-07 DIAGNOSIS — E785 Hyperlipidemia, unspecified: Secondary | ICD-10-CM | POA: Diagnosis not present

## 2016-04-07 DIAGNOSIS — F0391 Unspecified dementia with behavioral disturbance: Secondary | ICD-10-CM | POA: Diagnosis not present

## 2016-04-07 DIAGNOSIS — J302 Other seasonal allergic rhinitis: Secondary | ICD-10-CM | POA: Diagnosis not present

## 2016-04-07 DIAGNOSIS — E559 Vitamin D deficiency, unspecified: Secondary | ICD-10-CM | POA: Diagnosis not present

## 2016-04-13 ENCOUNTER — Telehealth: Payer: Self-pay | Admitting: Nurse Practitioner

## 2016-04-13 NOTE — Telephone Encounter (Signed)
Pt's daughter called says the pt has been angry, screaming, hyper for the past 2 days. The pt is accusing her of things that are not true. She said the pt started a new medication, lorazepam- Raelyn Number PA, she has been taking this for 4 nights. Could this cause the behavior.

## 2016-04-13 NOTE — Telephone Encounter (Signed)
I spoke to daughter, [name].  Pt is acting out, as per below.  She started taking 0.25mg  po qhs for sleep on 04-07-16. I told her this is a medication that helps with calming, anxiety.  She knows that dementia  progressively gets worse.  I relayed that UTI can cause agitation , confusion.  She did not see sx of this.  She is asking what can she do, when her mother gets that way (is it better to remain silent)?  I recommended [email or web address] for information.  Her daughter lives with them as well, (she is afraid of her) and would not be a substitute to give monalisa a break.  Another medication?  Please advise.

## 2016-04-13 NOTE — Telephone Encounter (Signed)
I called back after speaking to CM/NP. LMVM for monalisa, that seroquel option for psychotic behaviors, and applying the behavior modifications ([facility]) had good information relating to those.  Can make appt to discuss further.  Spoke to daughter.  I gave her the information.  Made appt for 04-30-16 at 1030.  She will look at the information for [email or web address].

## 2016-04-30 ENCOUNTER — Ambulatory Visit (INDEPENDENT_AMBULATORY_CARE_PROVIDER_SITE_OTHER): Payer: Medicare Other | Admitting: Nurse Practitioner

## 2016-04-30 ENCOUNTER — Encounter: Payer: Self-pay | Admitting: Nurse Practitioner

## 2016-04-30 VITALS — BP 126/68 | HR 84 | Ht 59.0 in | Wt 99.8 lb

## 2016-04-30 DIAGNOSIS — F0390 Unspecified dementia without behavioral disturbance: Secondary | ICD-10-CM

## 2016-04-30 DIAGNOSIS — R451 Restlessness and agitation: Secondary | ICD-10-CM

## 2016-04-30 MED ORDER — QUETIAPINE FUMARATE 25 MG PO TABS: 25.0000 mg | ORAL_TABLET | Freq: Every day | ORAL | 6 refills | Status: DC
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
## 2016-04-30 NOTE — Patient Instructions (Addendum)
Continue Exelon patch will refill Continue Namenda will refill Try Seroquel 25 mg for agitation  Next visit with Dr. Krista Blue in 6 months

## 2016-05-03 NOTE — Progress Notes (Signed)
I have reviewed and agreed above plan. 

## 2016-06-08 DIAGNOSIS — I2510 Atherosclerotic heart disease of native coronary artery without angina pectoris: Secondary | ICD-10-CM | POA: Diagnosis not present

## 2016-06-08 DIAGNOSIS — E785 Hyperlipidemia, unspecified: Secondary | ICD-10-CM | POA: Diagnosis not present

## 2016-06-08 DIAGNOSIS — F329 Major depressive disorder, single episode, unspecified: Secondary | ICD-10-CM | POA: Diagnosis not present

## 2016-06-08 DIAGNOSIS — I10 Essential (primary) hypertension: Secondary | ICD-10-CM | POA: Diagnosis not present

## 2016-06-08 DIAGNOSIS — F0391 Unspecified dementia with behavioral disturbance: Secondary | ICD-10-CM | POA: Diagnosis not present

## 2016-06-08 DIAGNOSIS — Z0000 Encounter for general adult medical examination without abnormal findings: Secondary | ICD-10-CM | POA: Diagnosis not present

## 2016-06-08 DIAGNOSIS — E559 Vitamin D deficiency, unspecified: Secondary | ICD-10-CM | POA: Diagnosis not present

## 2016-06-08 DIAGNOSIS — R7303 Prediabetes: Secondary | ICD-10-CM | POA: Diagnosis not present

## 2016-06-08 DIAGNOSIS — J302 Other seasonal allergic rhinitis: Secondary | ICD-10-CM | POA: Diagnosis not present

## 2016-06-08 DIAGNOSIS — I119 Hypertensive heart disease without heart failure: Secondary | ICD-10-CM | POA: Diagnosis not present

## 2016-07-01 DIAGNOSIS — E785 Hyperlipidemia, unspecified: Secondary | ICD-10-CM | POA: Diagnosis not present

## 2016-07-01 DIAGNOSIS — I2510 Atherosclerotic heart disease of native coronary artery without angina pectoris: Secondary | ICD-10-CM | POA: Diagnosis not present

## 2016-07-01 DIAGNOSIS — Z951 Presence of aortocoronary bypass graft: Secondary | ICD-10-CM | POA: Diagnosis not present

## 2016-07-01 DIAGNOSIS — R739 Hyperglycemia, unspecified: Secondary | ICD-10-CM | POA: Diagnosis not present

## 2016-07-06 DIAGNOSIS — I2510 Atherosclerotic heart disease of native coronary artery without angina pectoris: Secondary | ICD-10-CM | POA: Diagnosis not present

## 2016-07-06 DIAGNOSIS — J302 Other seasonal allergic rhinitis: Secondary | ICD-10-CM | POA: Diagnosis not present

## 2016-07-06 DIAGNOSIS — F0391 Unspecified dementia with behavioral disturbance: Secondary | ICD-10-CM | POA: Diagnosis not present

## 2016-07-06 DIAGNOSIS — F329 Major depressive disorder, single episode, unspecified: Secondary | ICD-10-CM | POA: Diagnosis not present

## 2016-07-06 DIAGNOSIS — E559 Vitamin D deficiency, unspecified: Secondary | ICD-10-CM | POA: Diagnosis not present

## 2016-07-06 DIAGNOSIS — E785 Hyperlipidemia, unspecified: Secondary | ICD-10-CM | POA: Diagnosis not present

## 2016-07-06 DIAGNOSIS — R7303 Prediabetes: Secondary | ICD-10-CM | POA: Diagnosis not present

## 2016-07-06 DIAGNOSIS — I119 Hypertensive heart disease without heart failure: Secondary | ICD-10-CM | POA: Diagnosis not present

## 2016-07-06 DIAGNOSIS — I10 Essential (primary) hypertension: Secondary | ICD-10-CM | POA: Diagnosis not present

## 2016-07-28 DIAGNOSIS — E785 Hyperlipidemia, unspecified: Secondary | ICD-10-CM | POA: Diagnosis not present

## 2016-07-28 DIAGNOSIS — I739 Peripheral vascular disease, unspecified: Secondary | ICD-10-CM | POA: Diagnosis not present

## 2016-07-28 DIAGNOSIS — I2510 Atherosclerotic heart disease of native coronary artery without angina pectoris: Secondary | ICD-10-CM | POA: Diagnosis not present

## 2016-09-07 ENCOUNTER — Ambulatory Visit (INDEPENDENT_AMBULATORY_CARE_PROVIDER_SITE_OTHER): Payer: Medicare Other | Admitting: Neurology

## 2016-09-07 ENCOUNTER — Encounter: Payer: Self-pay | Admitting: Neurology

## 2016-09-07 VITALS — BP 125/64 | HR 79 | Ht 59.0 in | Wt 101.0 lb

## 2016-09-07 DIAGNOSIS — R451 Restlessness and agitation: Secondary | ICD-10-CM

## 2016-09-07 DIAGNOSIS — F0281 Dementia in other diseases classified elsewhere with behavioral disturbance: Secondary | ICD-10-CM | POA: Diagnosis not present

## 2016-09-07 DIAGNOSIS — F0280 Dementia in other diseases classified elsewhere without behavioral disturbance: Secondary | ICD-10-CM

## 2016-09-07 DIAGNOSIS — G301 Alzheimer's disease with late onset: Secondary | ICD-10-CM

## 2016-09-07 MED ORDER — MEMANTINE HCL-DONEPEZIL HCL ER 28-10 MG PO CP24: 1.0000 | ORAL_CAPSULE | Freq: Every day | ORAL | 11 refills | Status: DC

## 2016-09-07 MED ORDER — RIVASTIGMINE 9.5 MG/24HR TD PT24: 9.5000 mg | MEDICATED_PATCH | Freq: Every day | TRANSDERMAL | 11 refills | Status: DC

## 2016-09-07 MED ORDER — MEMANTINE HCL 10 MG PO TABS: 10.0000 mg | ORAL_TABLET | Freq: Two times a day (BID) | ORAL | 4 refills | Status: DC

## 2016-09-07 MED ORDER — DIAZEPAM 5 MG PO TABS: 5.0000 mg | ORAL_TABLET | Freq: Four times a day (QID) | ORAL | 0 refills | Status: DC | PRN

## 2016-09-07 MED ORDER — QUETIAPINE FUMARATE 25 MG PO TABS: 25.0000 mg | ORAL_TABLET | Freq: Every day | ORAL | 6 refills | Status: DC
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
## 2016-09-08 LAB — COMPREHENSIVE METABOLIC PANEL
ALT: 17 IU/L (ref 0–32)
AST: 23 IU/L (ref 0–40)
Albumin/Globulin Ratio: 1.5 (ref 1.2–2.2)
Albumin: 4.2 g/dL (ref 3.5–4.7)
Alkaline Phosphatase: 83 IU/L (ref 39–117)
BUN/Creatinine Ratio: 36 — ABNORMAL HIGH (ref 12–28)
BUN: 26 mg/dL (ref 8–27)
Bilirubin Total: 0.3 mg/dL (ref 0.0–1.2)
CALCIUM: 9.5 mg/dL (ref 8.7–10.3)
CO2: 26 mmol/L (ref 18–29)
CREATININE: 0.72 mg/dL (ref 0.57–1.00)
Chloride: 106 mmol/L (ref 96–106)
GFR calc Af Amer: 86 mL/min/{1.73_m2} (ref >59)
GFR, EST NON AFRICAN AMERICAN: 74 mL/min/{1.73_m2} (ref >59)
Globulin, Total: 2.8 g/dL (ref 1.5–4.5)
Glucose: 88 mg/dL (ref 65–99)
Potassium: 5 mmol/L (ref 3.5–5.2)
SODIUM: 147 mmol/L — AB (ref 134–144)
Total Protein: 7 g/dL (ref 6.0–8.5)

## 2016-09-08 LAB — THYROID PANEL WITH TSH
Free Thyroxine Index: 1.8 (ref 1.2–4.9)
T3 Uptake Ratio: 24 % (ref 24–39)
T4 TOTAL: 7.3 ug/dL (ref 4.5–12.0)
TSH: 2.41 u[IU]/mL (ref 0.450–4.500)

## 2016-09-08 LAB — CBC
Hematocrit: 35.2 % (ref 34.0–46.6)
Hemoglobin: 11.2 g/dL (ref 11.1–15.9)
MCH: 25.2 pg — ABNORMAL LOW (ref 26.6–33.0)
MCHC: 31.8 g/dL (ref 31.5–35.7)
MCV: 79 fL (ref 79–97)
Platelets: 253 10*3/uL (ref 150–379)
RBC: 4.45 x10E6/uL (ref 3.77–5.28)
RDW: 16.4 % — ABNORMAL HIGH (ref 12.3–15.4)
WBC: 4.8 10*3/uL (ref 3.4–10.8)

## 2016-09-08 LAB — C-REACTIVE PROTEIN

## 2016-09-08 LAB — RPR: RPR Ser Ql: NONREACTIVE

## 2016-09-08 LAB — VITAMIN B12: Vitamin B-12: 250 pg/mL (ref 232–1245)

## 2016-10-18 ENCOUNTER — Ambulatory Visit
Admission: RE | Admit: 2016-10-18 | Discharge: 2016-10-18 | Disposition: A | Discharge status: Home / Self Care | Payer: Medicare Other | Source: Ambulatory Visit | Attending: Neurology | Admitting: Neurology

## 2016-10-18 DIAGNOSIS — F02818 Alzheimer's disease with late onset: Secondary | ICD-10-CM

## 2016-10-18 DIAGNOSIS — R451 Restlessness and agitation: Secondary | ICD-10-CM

## 2016-10-18 DIAGNOSIS — R413 Other amnesia: Secondary | ICD-10-CM | POA: Diagnosis not present

## 2016-10-18 DIAGNOSIS — F0280 Dementia in other diseases classified elsewhere without behavioral disturbance: Secondary | ICD-10-CM

## 2016-10-18 DIAGNOSIS — F0281 Dementia in other diseases classified elsewhere with behavioral disturbance: Secondary | ICD-10-CM

## 2016-10-18 DIAGNOSIS — G301 Alzheimer's disease with late onset: Secondary | ICD-10-CM | POA: Diagnosis not present

## 2016-12-14 DIAGNOSIS — R7303 Prediabetes: Secondary | ICD-10-CM | POA: Diagnosis not present

## 2016-12-14 DIAGNOSIS — F329 Major depressive disorder, single episode, unspecified: Secondary | ICD-10-CM | POA: Diagnosis not present

## 2016-12-14 DIAGNOSIS — E559 Vitamin D deficiency, unspecified: Secondary | ICD-10-CM | POA: Diagnosis not present

## 2016-12-14 DIAGNOSIS — I2510 Atherosclerotic heart disease of native coronary artery without angina pectoris: Secondary | ICD-10-CM | POA: Diagnosis not present

## 2016-12-14 DIAGNOSIS — J302 Other seasonal allergic rhinitis: Secondary | ICD-10-CM | POA: Diagnosis not present

## 2016-12-14 DIAGNOSIS — F0391 Unspecified dementia with behavioral disturbance: Secondary | ICD-10-CM | POA: Diagnosis not present

## 2016-12-14 DIAGNOSIS — I119 Hypertensive heart disease without heart failure: Secondary | ICD-10-CM | POA: Diagnosis not present

## 2016-12-14 DIAGNOSIS — Z01118 Encounter for examination of ears and hearing with other abnormal findings: Secondary | ICD-10-CM | POA: Diagnosis not present

## 2016-12-14 DIAGNOSIS — E785 Hyperlipidemia, unspecified: Secondary | ICD-10-CM | POA: Diagnosis not present

## 2016-12-14 DIAGNOSIS — I10 Essential (primary) hypertension: Secondary | ICD-10-CM | POA: Diagnosis not present

## 2016-12-21 ENCOUNTER — Ambulatory Visit (INDEPENDENT_AMBULATORY_CARE_PROVIDER_SITE_OTHER): Payer: Medicare Other | Admitting: Neurology

## 2016-12-21 ENCOUNTER — Encounter: Payer: Self-pay | Admitting: Neurology

## 2016-12-21 VITALS — BP 146/67 | HR 75 | Ht 59.0 in | Wt 99.5 lb

## 2016-12-21 DIAGNOSIS — G301 Alzheimer's disease with late onset: Secondary | ICD-10-CM

## 2016-12-21 DIAGNOSIS — F0281 Dementia in other diseases classified elsewhere with behavioral disturbance: Secondary | ICD-10-CM | POA: Diagnosis not present

## 2016-12-21 DIAGNOSIS — F02818 Dementia in other diseases classified elsewhere, unspecified severity, with other behavioral disturbance: Secondary | ICD-10-CM

## 2016-12-21 MED ORDER — QUETIAPINE FUMARATE 25 MG PO TABS: 25.0000 mg | ORAL_TABLET | Freq: Every day | ORAL | 4 refills | Status: DC

## 2016-12-21 MED ORDER — RIVASTIGMINE 9.5 MG/24HR TD PT24: 9.5000 mg | MEDICATED_PATCH | Freq: Every day | TRANSDERMAL | 4 refills | Status: DC
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
## 2017-01-11 DIAGNOSIS — E559 Vitamin D deficiency, unspecified: Secondary | ICD-10-CM | POA: Diagnosis not present

## 2017-01-11 DIAGNOSIS — I119 Hypertensive heart disease without heart failure: Secondary | ICD-10-CM | POA: Diagnosis not present

## 2017-01-11 DIAGNOSIS — F329 Major depressive disorder, single episode, unspecified: Secondary | ICD-10-CM | POA: Diagnosis not present

## 2017-01-11 DIAGNOSIS — F0391 Unspecified dementia with behavioral disturbance: Secondary | ICD-10-CM | POA: Diagnosis not present

## 2017-01-11 DIAGNOSIS — E785 Hyperlipidemia, unspecified: Secondary | ICD-10-CM | POA: Diagnosis not present

## 2017-01-11 DIAGNOSIS — I2510 Atherosclerotic heart disease of native coronary artery without angina pectoris: Secondary | ICD-10-CM | POA: Diagnosis not present

## 2017-01-11 DIAGNOSIS — I10 Essential (primary) hypertension: Secondary | ICD-10-CM | POA: Diagnosis not present

## 2017-01-11 DIAGNOSIS — R7303 Prediabetes: Secondary | ICD-10-CM | POA: Diagnosis not present

## 2017-01-11 DIAGNOSIS — J302 Other seasonal allergic rhinitis: Secondary | ICD-10-CM | POA: Diagnosis not present

## 2017-03-04 DIAGNOSIS — H9192 Unspecified hearing loss, left ear: Secondary | ICD-10-CM | POA: Diagnosis not present

## 2017-03-08 DIAGNOSIS — I2510 Atherosclerotic heart disease of native coronary artery without angina pectoris: Secondary | ICD-10-CM | POA: Diagnosis not present

## 2017-03-08 DIAGNOSIS — E785 Hyperlipidemia, unspecified: Secondary | ICD-10-CM | POA: Diagnosis not present

## 2017-03-08 DIAGNOSIS — F0391 Unspecified dementia with behavioral disturbance: Secondary | ICD-10-CM | POA: Diagnosis not present

## 2017-03-08 DIAGNOSIS — I119 Hypertensive heart disease without heart failure: Secondary | ICD-10-CM | POA: Diagnosis not present

## 2017-03-08 DIAGNOSIS — J302 Other seasonal allergic rhinitis: Secondary | ICD-10-CM | POA: Diagnosis not present

## 2017-03-08 DIAGNOSIS — R7303 Prediabetes: Secondary | ICD-10-CM | POA: Diagnosis not present

## 2017-03-08 DIAGNOSIS — F329 Major depressive disorder, single episode, unspecified: Secondary | ICD-10-CM | POA: Diagnosis not present

## 2017-03-08 DIAGNOSIS — I10 Essential (primary) hypertension: Secondary | ICD-10-CM | POA: Diagnosis not present

## 2017-03-08 DIAGNOSIS — E559 Vitamin D deficiency, unspecified: Secondary | ICD-10-CM | POA: Diagnosis not present

## 2017-03-16 DIAGNOSIS — H903 Sensorineural hearing loss, bilateral: Secondary | ICD-10-CM | POA: Diagnosis not present

## 2017-05-04 DIAGNOSIS — E785 Hyperlipidemia, unspecified: Secondary | ICD-10-CM | POA: Diagnosis not present

## 2017-05-04 DIAGNOSIS — I119 Hypertensive heart disease without heart failure: Secondary | ICD-10-CM | POA: Diagnosis not present

## 2017-05-04 DIAGNOSIS — R7303 Prediabetes: Secondary | ICD-10-CM | POA: Diagnosis not present

## 2017-05-04 DIAGNOSIS — F329 Major depressive disorder, single episode, unspecified: Secondary | ICD-10-CM | POA: Diagnosis not present

## 2017-05-04 DIAGNOSIS — I10 Essential (primary) hypertension: Secondary | ICD-10-CM | POA: Diagnosis not present

## 2017-05-04 DIAGNOSIS — F0391 Unspecified dementia with behavioral disturbance: Secondary | ICD-10-CM | POA: Diagnosis not present

## 2017-05-04 DIAGNOSIS — E559 Vitamin D deficiency, unspecified: Secondary | ICD-10-CM | POA: Diagnosis not present

## 2017-05-04 DIAGNOSIS — J302 Other seasonal allergic rhinitis: Secondary | ICD-10-CM | POA: Diagnosis not present

## 2017-05-04 DIAGNOSIS — I2510 Atherosclerotic heart disease of native coronary artery without angina pectoris: Secondary | ICD-10-CM | POA: Diagnosis not present

## 2017-06-10 DIAGNOSIS — E559 Vitamin D deficiency, unspecified: Secondary | ICD-10-CM | POA: Diagnosis not present

## 2017-06-10 DIAGNOSIS — F0391 Unspecified dementia with behavioral disturbance: Secondary | ICD-10-CM | POA: Diagnosis not present

## 2017-06-10 DIAGNOSIS — I2510 Atherosclerotic heart disease of native coronary artery without angina pectoris: Secondary | ICD-10-CM | POA: Diagnosis not present

## 2017-06-10 DIAGNOSIS — R7303 Prediabetes: Secondary | ICD-10-CM | POA: Diagnosis not present

## 2017-06-10 DIAGNOSIS — F329 Major depressive disorder, single episode, unspecified: Secondary | ICD-10-CM | POA: Diagnosis not present

## 2017-06-10 DIAGNOSIS — I10 Essential (primary) hypertension: Secondary | ICD-10-CM | POA: Diagnosis not present

## 2017-06-10 DIAGNOSIS — Z0000 Encounter for general adult medical examination without abnormal findings: Secondary | ICD-10-CM | POA: Diagnosis not present

## 2017-06-10 DIAGNOSIS — E785 Hyperlipidemia, unspecified: Secondary | ICD-10-CM | POA: Diagnosis not present

## 2017-06-10 DIAGNOSIS — J302 Other seasonal allergic rhinitis: Secondary | ICD-10-CM | POA: Diagnosis not present

## 2017-06-10 DIAGNOSIS — I119 Hypertensive heart disease without heart failure: Secondary | ICD-10-CM | POA: Diagnosis not present

## 2017-07-08 ENCOUNTER — Telehealth: Payer: Self-pay | Admitting: Diagnostic Neuroimaging

## 2017-07-08 NOTE — Telephone Encounter (Signed)
Patient's daughter called in re: exelon patch. Needs insurance approval. Will forward to Dr. Krista Blue and Sharyn Lull to review.   Penni Bombard, MD 7/86/7672, 0:94 PM Certified in Neurology, Neurophysiology and Neuroimaging  [facility] [address] [place], [place] [address] [phone]

## 2017-07-11 NOTE — Telephone Encounter (Signed)
I called [employer] (ID [id number], b/c we do not have this information on file) and was made aware that patient last filled the Exelon on 07/10/17 and this is why it is being rejected, it is too soon to fill. I was also made aware that the generic is a covered medication on her plan, a PA is only needed if patient must have brand name.   I tried calling patient's daughter back to make her aware of this information, but the home number ([phone]) is no longer in service and the mobile [phone]) doesn't ring at all, just stays silent.

## 2017-07-20 NOTE — Telephone Encounter (Signed)
Pt daughter called stating that rivastigmine (EXELON) 9.5 mg/24hr isnt covered under pts insurance, unless a form is filled out stating why the pt is needing this particular medication, Please call to advise

## 2017-07-21 NOTE — Telephone Encounter (Signed)
Spoke to daughter, [name], received that exelon not on [place].  Gave 30 day supply.  Has feveral MCR.  [employer] prescription plan ID [id number], [place] [id number], PCN: [id number], [place]  [phone]. Has patches (BN) exelon for 30 days (up till 08-07-17).

## 2017-07-21 NOTE — Telephone Encounter (Signed)
I spoke to pharmacy, Roland Earl, pharmacist  and per note below by Elmyra Ricks, that both pharmacist and  [employer] states that generic is available.  Pt should be able to get this no problem at the 90 day supply.   I recommended to call pharmacy about 7 days prior to running out and ask for generic exelon.  If problem will call us back. Monalisa was appreciative.

## 2017-08-12 DIAGNOSIS — I10 Essential (primary) hypertension: Secondary | ICD-10-CM | POA: Diagnosis not present

## 2017-08-12 DIAGNOSIS — B372 Candidiasis of skin and nail: Secondary | ICD-10-CM | POA: Diagnosis not present

## 2017-08-12 DIAGNOSIS — F329 Major depressive disorder, single episode, unspecified: Secondary | ICD-10-CM | POA: Diagnosis not present

## 2017-08-12 DIAGNOSIS — E785 Hyperlipidemia, unspecified: Secondary | ICD-10-CM | POA: Diagnosis not present

## 2017-08-12 DIAGNOSIS — I119 Hypertensive heart disease without heart failure: Secondary | ICD-10-CM | POA: Diagnosis not present

## 2017-08-12 DIAGNOSIS — R7303 Prediabetes: Secondary | ICD-10-CM | POA: Diagnosis not present

## 2017-08-12 DIAGNOSIS — I2510 Atherosclerotic heart disease of native coronary artery without angina pectoris: Secondary | ICD-10-CM | POA: Diagnosis not present

## 2017-08-12 DIAGNOSIS — J302 Other seasonal allergic rhinitis: Secondary | ICD-10-CM | POA: Diagnosis not present

## 2017-08-12 DIAGNOSIS — F0391 Unspecified dementia with behavioral disturbance: Secondary | ICD-10-CM | POA: Diagnosis not present

## 2017-08-12 DIAGNOSIS — E559 Vitamin D deficiency, unspecified: Secondary | ICD-10-CM | POA: Diagnosis not present
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
## 2017-12-21 ENCOUNTER — Ambulatory Visit (INDEPENDENT_AMBULATORY_CARE_PROVIDER_SITE_OTHER): Payer: Medicare Other | Admitting: Nurse Practitioner

## 2017-12-21 ENCOUNTER — Encounter: Payer: Self-pay | Admitting: Nurse Practitioner

## 2017-12-21 VITALS — BP 124/54 | HR 77 | Ht 62.0 in | Wt 98.4 lb

## 2017-12-21 DIAGNOSIS — R451 Restlessness and agitation: Secondary | ICD-10-CM

## 2017-12-21 DIAGNOSIS — F0280 Dementia in other diseases classified elsewhere without behavioral disturbance: Secondary | ICD-10-CM

## 2017-12-21 DIAGNOSIS — G301 Alzheimer's disease with late onset: Secondary | ICD-10-CM | POA: Diagnosis not present

## 2017-12-21 DIAGNOSIS — F0281 Dementia in other diseases classified elsewhere with behavioral disturbance: Secondary | ICD-10-CM | POA: Diagnosis not present

## 2017-12-21 MED ORDER — MEMANTINE HCL 10 MG PO TABS: 10.0000 mg | ORAL_TABLET | Freq: Two times a day (BID) | ORAL | 3 refills | Status: DC

## 2017-12-21 MED ORDER — QUETIAPINE FUMARATE 25 MG PO TABS
25.0000 mg | ORAL_TABLET | Freq: Every day | ORAL | 3 refills | Status: DC
Start: 2017-12-21 — End: 2018-11-20

## 2017-12-21 MED ORDER — RIVASTIGMINE 9.5 MG/24HR TD PT24: 9.5000 mg | MEDICATED_PATCH | Freq: Every day | TRANSDERMAL | 3 refills | Status: DC

## 2017-12-21 NOTE — Patient Instructions (Signed)
Continue Exelon patch will refill Continue Namenda will refill Continue Seroquel 25 mg for agitation  Reduce confusion, keep familiar objects and people around, stick to a routine Use effective communication such as simple words and short sentences Reduce nighttime restlessness, a consistent nighttime routine,  avoid napping during the day Encourage good nutrition and hydration,  small frequent meals during the day F/U in 6 months

## 2017-12-23 NOTE — Progress Notes (Signed)
I have reviewed and agreed above plan. 

## 2017-12-30 DIAGNOSIS — F0391 Unspecified dementia with behavioral disturbance: Secondary | ICD-10-CM | POA: Diagnosis not present

## 2017-12-30 DIAGNOSIS — F329 Major depressive disorder, single episode, unspecified: Secondary | ICD-10-CM | POA: Diagnosis not present

## 2017-12-30 DIAGNOSIS — E785 Hyperlipidemia, unspecified: Secondary | ICD-10-CM | POA: Diagnosis not present

## 2017-12-30 DIAGNOSIS — I119 Hypertensive heart disease without heart failure: Secondary | ICD-10-CM | POA: Diagnosis not present

## 2017-12-30 DIAGNOSIS — R7303 Prediabetes: Secondary | ICD-10-CM | POA: Diagnosis not present

## 2017-12-30 DIAGNOSIS — E559 Vitamin D deficiency, unspecified: Secondary | ICD-10-CM | POA: Diagnosis not present

## 2017-12-30 DIAGNOSIS — I2510 Atherosclerotic heart disease of native coronary artery without angina pectoris: Secondary | ICD-10-CM | POA: Diagnosis not present

## 2017-12-30 DIAGNOSIS — I10 Essential (primary) hypertension: Secondary | ICD-10-CM | POA: Diagnosis not present

## 2017-12-30 DIAGNOSIS — J302 Other seasonal allergic rhinitis: Secondary | ICD-10-CM | POA: Diagnosis not present

## 2018-05-31 ENCOUNTER — Telehealth: Payer: Self-pay | Admitting: Nurse Practitioner

## 2018-05-31 NOTE — Telephone Encounter (Signed)
I called and spoke to reya, at [facility].  Last fill was 03/2018. Next fill 06/21/2018.  I told her, monalisa,  to refill one week prior to 06-21-18 and pharmacy will run thru and see if [facility] will cover. (same insurance per pharmacy).  If not on formulary will ask for PA .   Daughter stated will do this.  Appreciated call.

## 2018-05-31 NOTE — Telephone Encounter (Signed)
Pt daughter(on DPR-Zaragoza,Monalisa) has called DU:[id number] (EXELON) 9.5 mg/24hr no longer being covered by insurance, she is asking what can be called in place of these

## 2018-06-20 ENCOUNTER — Telehealth: Payer: Self-pay

## 2018-06-20 NOTE — Telephone Encounter (Signed)
Pending approval for Rivastigmine Rx number: [id number] ICD 10 code: F03.90 PA Case ID: [id number]

## 2018-06-20 NOTE — Telephone Encounter (Signed)
Key: [id number]

## 2018-06-21 NOTE — Telephone Encounter (Signed)
Patient has been on the Exelon patch since 2017 please see if we can appeal this

## 2018-06-21 NOTE — Telephone Encounter (Signed)
Received denial for rivastigmine 9.5mg  patch [employer] part D.  I called asking what was on formulary list: donepezil - she could not tolerate,  memantine - which she is on, and galantamine ER 24 hr cap is other option.  We can appeal or try galantamine cap.  Please advise.

## 2018-06-22 ENCOUNTER — Encounter: Payer: Self-pay | Admitting: *Deleted

## 2018-06-22 NOTE — Telephone Encounter (Signed)
Appeal letter completed, signed by CM/NP.  Fax confirmation received [phone] [facility]) expedited appeal. ID [id number].

## 2018-06-27 NOTE — Telephone Encounter (Signed)
Received fax determination as APPROVED for Rivastigmine 9.5mg  / 24 hour patch form 06-17-18 thru 05-17-19.  [place]  ID [id number].  Reference [id number].  [phone], fax [phone].

## 2018-06-27 NOTE — Telephone Encounter (Signed)
Fax confirmation received for additional questions, answered per CM/NP.  06-26-2018.  [phone].  Determination pending.

## 2018-06-30 DIAGNOSIS — E559 Vitamin D deficiency, unspecified: Secondary | ICD-10-CM | POA: Diagnosis not present

## 2018-06-30 DIAGNOSIS — I119 Hypertensive heart disease without heart failure: Secondary | ICD-10-CM | POA: Diagnosis not present

## 2018-06-30 DIAGNOSIS — J302 Other seasonal allergic rhinitis: Secondary | ICD-10-CM | POA: Diagnosis not present

## 2018-06-30 DIAGNOSIS — R7303 Prediabetes: Secondary | ICD-10-CM | POA: Diagnosis not present

## 2018-06-30 DIAGNOSIS — Z0001 Encounter for general adult medical examination with abnormal findings: Secondary | ICD-10-CM | POA: Diagnosis not present

## 2018-06-30 DIAGNOSIS — E785 Hyperlipidemia, unspecified: Secondary | ICD-10-CM | POA: Diagnosis not present

## 2018-06-30 DIAGNOSIS — B372 Candidiasis of skin and nail: Secondary | ICD-10-CM | POA: Diagnosis not present

## 2018-06-30 DIAGNOSIS — I10 Essential (primary) hypertension: Secondary | ICD-10-CM | POA: Diagnosis not present

## 2018-06-30 DIAGNOSIS — F0391 Unspecified dementia with behavioral disturbance: Secondary | ICD-10-CM | POA: Diagnosis not present

## 2018-06-30 DIAGNOSIS — F329 Major depressive disorder, single episode, unspecified: Secondary | ICD-10-CM | POA: Diagnosis not present

## 2018-06-30 DIAGNOSIS — I2510 Atherosclerotic heart disease of native coronary artery without angina pectoris: Secondary | ICD-10-CM | POA: Diagnosis not present

## 2018-10-13 DIAGNOSIS — I119 Hypertensive heart disease without heart failure: Secondary | ICD-10-CM | POA: Diagnosis not present

## 2018-10-13 DIAGNOSIS — I10 Essential (primary) hypertension: Secondary | ICD-10-CM | POA: Diagnosis not present

## 2018-10-13 DIAGNOSIS — F329 Major depressive disorder, single episode, unspecified: Secondary | ICD-10-CM | POA: Diagnosis not present

## 2018-10-13 DIAGNOSIS — F0391 Unspecified dementia with behavioral disturbance: Secondary | ICD-10-CM | POA: Diagnosis not present

## 2018-10-13 DIAGNOSIS — R7303 Prediabetes: Secondary | ICD-10-CM | POA: Diagnosis not present

## 2018-10-13 DIAGNOSIS — R630 Anorexia: Secondary | ICD-10-CM | POA: Diagnosis not present

## 2018-10-13 DIAGNOSIS — E559 Vitamin D deficiency, unspecified: Secondary | ICD-10-CM | POA: Diagnosis not present

## 2018-10-13 DIAGNOSIS — E785 Hyperlipidemia, unspecified: Secondary | ICD-10-CM | POA: Diagnosis not present

## 2018-10-13 DIAGNOSIS — J302 Other seasonal allergic rhinitis: Secondary | ICD-10-CM | POA: Diagnosis not present

## 2018-10-13 DIAGNOSIS — I2510 Atherosclerotic heart disease of native coronary artery without angina pectoris: Secondary | ICD-10-CM | POA: Diagnosis not present

## 2018-11-20 ENCOUNTER — Other Ambulatory Visit: Payer: Self-pay | Admitting: *Deleted

## 2018-11-20 MED ORDER — QUETIAPINE FUMARATE 25 MG PO TABS: 25.0000 mg | ORAL_TABLET | Freq: Every day | ORAL | 3 refills | Status: AC

## 2018-12-12 ENCOUNTER — Telehealth: Payer: Self-pay | Admitting: Neurology

## 2018-12-12 NOTE — Telephone Encounter (Signed)
I called patient and LVM regarding rescheduling 8/7 appointment due to office being closed. Requested patient call back to reschedule.

## 2018-12-15 DIAGNOSIS — E785 Hyperlipidemia, unspecified: Secondary | ICD-10-CM | POA: Diagnosis not present

## 2018-12-15 DIAGNOSIS — R7303 Prediabetes: Secondary | ICD-10-CM | POA: Diagnosis not present

## 2018-12-15 DIAGNOSIS — F329 Major depressive disorder, single episode, unspecified: Secondary | ICD-10-CM | POA: Diagnosis not present

## 2018-12-15 DIAGNOSIS — I2510 Atherosclerotic heart disease of native coronary artery without angina pectoris: Secondary | ICD-10-CM | POA: Diagnosis not present

## 2018-12-15 DIAGNOSIS — I119 Hypertensive heart disease without heart failure: Secondary | ICD-10-CM | POA: Diagnosis not present

## 2018-12-15 DIAGNOSIS — F0391 Unspecified dementia with behavioral disturbance: Secondary | ICD-10-CM | POA: Diagnosis not present

## 2018-12-15 DIAGNOSIS — R630 Anorexia: Secondary | ICD-10-CM | POA: Diagnosis not present

## 2018-12-15 DIAGNOSIS — E559 Vitamin D deficiency, unspecified: Secondary | ICD-10-CM | POA: Diagnosis not present

## 2018-12-15 DIAGNOSIS — I10 Essential (primary) hypertension: Secondary | ICD-10-CM | POA: Diagnosis not present

## 2018-12-15 DIAGNOSIS — J302 Other seasonal allergic rhinitis: Secondary | ICD-10-CM | POA: Diagnosis not present

## 2018-12-22 ENCOUNTER — Ambulatory Visit: Payer: Medicare Other | Admitting: Neurology

## 2019-01-11 ENCOUNTER — Telehealth: Payer: Self-pay | Admitting: Neurology

## 2019-01-11 NOTE — Telephone Encounter (Signed)
Called pt to inform her NP Judson Roch will be out of office due to family emergency next week lvm to inform pt the appt will be c/a and to call back to r/s

## 2019-01-16 ENCOUNTER — Ambulatory Visit: Payer: Medicare Other | Admitting: Neurology

## 2019-01-18 ENCOUNTER — Other Ambulatory Visit: Payer: Self-pay

## 2019-01-18 ENCOUNTER — Ambulatory Visit (INDEPENDENT_AMBULATORY_CARE_PROVIDER_SITE_OTHER): Payer: Medicare Other | Admitting: Family Medicine

## 2019-01-18 ENCOUNTER — Encounter: Payer: Self-pay | Admitting: Family Medicine

## 2019-01-18 DIAGNOSIS — G301 Alzheimer's disease with late onset: Secondary | ICD-10-CM

## 2019-01-18 DIAGNOSIS — F0280 Dementia in other diseases classified elsewhere without behavioral disturbance: Secondary | ICD-10-CM | POA: Diagnosis not present

## 2019-01-18 MED ORDER — MEMANTINE HCL 10 MG PO TABS: 10.0000 mg | ORAL_TABLET | Freq: Two times a day (BID) | ORAL | 3 refills | Status: AC

## 2019-01-18 MED ORDER — RIVASTIGMINE 9.5 MG/24HR TD PT24: 9.5000 mg | MEDICATED_PATCH | Freq: Every day | TRANSDERMAL | 3 refills | Status: AC

## 2019-01-18 NOTE — Progress Notes (Signed)
I have reviewed and agreed above plan. 
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
## 2019-01-18 NOTE — Patient Instructions (Signed)
Continue current medications as prescribed  We are happy to send office notes if needed to new provider in [place]  Follow up in 1 year, sooner if needed   Alzheimer Disease Caregiver Guide  Alzheimer disease causes a person to lose the ability to remember things and make decisions. A person who has Alzheimer disease may not be able to take care of himself or herself. He or she may need help with simple tasks. The tips below can help you care for the person. What kind of changes does this condition cause? This condition makes a person:  Forget things.  Feel confused.  Act differently.  Have different moods. These things get worse with time. Tips to help with symptoms  Be calm and patient.  Respond with a simple, short answer.  Avoid correcting the person in a negative way.  Try not to take things personally, even if the person forgets your name.  Do not argue with the person. This may make the person more upset. Tips to lessen frustration  Make appointments and do daily tasks when the person is at his or her best.  Take your time. Simple tasks may take longer. Allow plenty of time to complete tasks.  Limit choices for the person.  Involve the person in what you are doing.  Keep a daily routine.  Avoid new or crowded places, if possible.  Use simple words, short sentences, and a calm voice. Only give one direction at a time.  Buy clothes and shoes that are easy to put on and take off.  Organize medicines in a pillbox for each day of the week.  Keep a calendar in a central location to remind the person of meetings or other activities.  Let people help if they offer. Take a break when needed. Tips to prevent injury  Keep floors clear. Remove rugs, magazine racks, and floor lamps.  Keep hallways well-lit.  Put a handrail and non-slip mat in the bathtub or shower.  Put childproof locks on cabinets that have dangerous items in them. These items include  medicine, alcohol, guns, toxic cleaning items, sharp tools, matches, and lighters.  Put locks on doors where the person cannot see or reach them. This helps the person to not wander out of the house and get lost.  Be prepared for emergencies. Keep a list of emergency phone numbers and addresses close by.  Bracelets may be worn that track location and identify the person as having memory problems. This should be worn at all times for safety. Tips for the future  Discuss financial and legal planning early. People with this disease have trouble managing their money as the disease gets worse. Get help from a professional.  Talk about advance directives, safety, and daily care. Take these steps: ? Create a living will and choose a power of attorney. This is someone who can make decisions for the person with Alzheimer disease when he or she can no longer do so. ? Discuss driving safety and when to stop driving. The person's doctor can help with this. ? If the person lives alone, make sure he or she is safe. Some people need extra help at home. Other people need more care at a nursing home or care center. Where to find support You can find support by joining a support group near you. Some benefits of joining a support group include:  Learning ways to manage stress.  Sharing experiences with others.  Getting emotional comfort and support.  Learning  about caregiving as the disease progresses.  Knowing what community resources are available and making use of them. Where to find more information  Alzheimer's Association: [employer] Contact a doctor if:  The person has a fever.  The person has a sudden behavior change that does not get better with calming strategies.  The person is not able to take care of himself or herself at home.  The person threatens you or anyone else, including himself or herself.  You are no longer able to care for the person. Summary  Alzheimer disease causes  a person to forget things and to be confused.  A person who has this condition may not be able to take care of himself or herself.  Take steps to keep the person from getting hurt. Plan for future care.  You can find support by joining a support group near you. This information is not intended to replace advice given to you by your health care provider. Make sure you discuss any questions you have with your health care provider. Document Released: 07/26/2011 Document Revised: 08/22/2018 Document Reviewed: 04/28/2017 Elsevier Patient Education  2020 [employer].

## 2019-06-27 DIAGNOSIS — G309 Alzheimer's disease, unspecified: Secondary | ICD-10-CM | POA: Diagnosis not present

## 2019-06-27 DIAGNOSIS — Z7189 Other specified counseling: Secondary | ICD-10-CM | POA: Diagnosis not present

## 2019-06-27 DIAGNOSIS — Z713 Dietary counseling and surveillance: Secondary | ICD-10-CM | POA: Diagnosis not present

## 2019-08-13 ENCOUNTER — Telehealth: Payer: Self-pay | Admitting: Family Medicine

## 2019-08-13 NOTE — Telephone Encounter (Signed)
Pt's daughter [name] called stating that the pharmacy is asking for a P.A. nubmer for the pt's rivastigmine (EXELON) 9.5 mg/24hr They are staying in [place] with her son for now so they are needing it filled in the [place] near them phone number [phone] [place], [place]

## 2019-08-14 NOTE — Telephone Encounter (Signed)
Received approval from [facility] for rivvastigmine thru 05-16-2020.  Faxed to [place] [phone].

## 2019-08-14 NOTE — Telephone Encounter (Signed)
I spoke to daughter of pt.  She is living in [place] at this time for several months.  Needing her exelon patches.  I initiated CMM KEY [id number] Rivastigmine 9.4mg  patch/24hour. Determination pending 3-7 days.
